# Patient Record
Sex: Female | Born: 1975 | Race: Black or African American | Hispanic: No | Marital: Married | Smoking: Never smoker
Health system: Southern US, Community
[De-identification: ages and names within clinical notes are randomized; demographics above are authoritative.]

## PROBLEM LIST (undated history)

## (undated) ENCOUNTER — Inpatient Hospital Stay (HOSPITAL_COMMUNITY): Payer: Self-pay

## (undated) DIAGNOSIS — B373 Candidiasis of vulva and vagina: Secondary | ICD-10-CM

## (undated) DIAGNOSIS — B999 Unspecified infectious disease: Secondary | ICD-10-CM

## (undated) DIAGNOSIS — N644 Mastodynia: Secondary | ICD-10-CM

## (undated) DIAGNOSIS — Z8742 Personal history of other diseases of the female genital tract: Secondary | ICD-10-CM

## (undated) DIAGNOSIS — R51 Headache: Secondary | ICD-10-CM

## (undated) DIAGNOSIS — H9190 Unspecified hearing loss, unspecified ear: Secondary | ICD-10-CM

## (undated) DIAGNOSIS — Z8669 Personal history of other diseases of the nervous system and sense organs: Secondary | ICD-10-CM

## (undated) DIAGNOSIS — O139 Gestational [pregnancy-induced] hypertension without significant proteinuria, unspecified trimester: Secondary | ICD-10-CM

## (undated) DIAGNOSIS — L739 Follicular disorder, unspecified: Secondary | ICD-10-CM

## (undated) DIAGNOSIS — Z87898 Personal history of other specified conditions: Secondary | ICD-10-CM

## (undated) DIAGNOSIS — Z8619 Personal history of other infectious and parasitic diseases: Secondary | ICD-10-CM

## (undated) DIAGNOSIS — D219 Benign neoplasm of connective and other soft tissue, unspecified: Secondary | ICD-10-CM

## (undated) DIAGNOSIS — IMO0002 Reserved for concepts with insufficient information to code with codable children: Secondary | ICD-10-CM

## (undated) DIAGNOSIS — Z8719 Personal history of other diseases of the digestive system: Secondary | ICD-10-CM

## (undated) DIAGNOSIS — N760 Acute vaginitis: Secondary | ICD-10-CM

## (undated) DIAGNOSIS — F419 Anxiety disorder, unspecified: Secondary | ICD-10-CM

## (undated) DIAGNOSIS — E042 Nontoxic multinodular goiter: Secondary | ICD-10-CM

## (undated) DIAGNOSIS — R102 Pelvic and perineal pain: Secondary | ICD-10-CM

## (undated) DIAGNOSIS — R87619 Unspecified abnormal cytological findings in specimens from cervix uteri: Secondary | ICD-10-CM

## (undated) DIAGNOSIS — N870 Mild cervical dysplasia: Secondary | ICD-10-CM

## (undated) DIAGNOSIS — L732 Hidradenitis suppurativa: Secondary | ICD-10-CM

## (undated) DIAGNOSIS — N764 Abscess of vulva: Secondary | ICD-10-CM

## (undated) DIAGNOSIS — Z87440 Personal history of urinary (tract) infections: Secondary | ICD-10-CM

## (undated) HISTORY — DX: Candidiasis of vulva and vagina: B37.3

## (undated) HISTORY — DX: Mild cervical dysplasia: N87.0

## (undated) HISTORY — DX: Hidradenitis suppurativa: L73.2

## (undated) HISTORY — DX: Unspecified abnormal cytological findings in specimens from cervix uteri: R87.619

## (undated) HISTORY — DX: Personal history of urinary (tract) infections: Z87.440

## (undated) HISTORY — DX: Mastodynia: N64.4

## (undated) HISTORY — DX: Personal history of other diseases of the female genital tract: Z87.42

## (undated) HISTORY — DX: Headache: R51

## (undated) HISTORY — DX: Personal history of other diseases of the nervous system and sense organs: Z86.69

## (undated) HISTORY — DX: Follicular disorder, unspecified: L73.9

## (undated) HISTORY — DX: Personal history of other specified conditions: Z87.898

## (undated) HISTORY — DX: Unspecified infectious disease: B99.9

## (undated) HISTORY — DX: Abscess of vulva: N76.4

## (undated) HISTORY — DX: Reserved for concepts with insufficient information to code with codable children: IMO0002

## (undated) HISTORY — DX: Benign neoplasm of connective and other soft tissue, unspecified: D21.9

## (undated) HISTORY — PX: DILATION AND CURETTAGE OF UTERUS: SHX78

## (undated) HISTORY — DX: Personal history of other infectious and parasitic diseases: Z86.19

## (undated) HISTORY — DX: Personal history of other diseases of the digestive system: Z87.19

## (undated) HISTORY — DX: Acute vaginitis: N76.0

## (undated) HISTORY — DX: Pelvic and perineal pain: R10.2

## (undated) HISTORY — DX: Unspecified hearing loss, unspecified ear: H91.90

---

## 1995-01-23 DIAGNOSIS — B999 Unspecified infectious disease: Secondary | ICD-10-CM

## 1995-01-23 HISTORY — DX: Unspecified infectious disease: B99.9

## 1997-07-11 ENCOUNTER — Emergency Department (HOSPITAL_COMMUNITY): Admission: EM | Admit: 1997-07-11 | Discharge: 1997-07-11 | Payer: Self-pay | Admitting: *Deleted

## 1997-10-15 ENCOUNTER — Encounter: Admission: RE | Admit: 1997-10-15 | Discharge: 1997-10-15 | Payer: Self-pay | Admitting: Internal Medicine

## 1997-12-25 ENCOUNTER — Emergency Department (HOSPITAL_COMMUNITY): Admission: EM | Admit: 1997-12-25 | Discharge: 1997-12-25 | Payer: Self-pay | Admitting: Emergency Medicine

## 1998-01-22 DIAGNOSIS — N870 Mild cervical dysplasia: Secondary | ICD-10-CM

## 1998-01-22 HISTORY — DX: Mild cervical dysplasia: N87.0

## 1998-03-28 ENCOUNTER — Other Ambulatory Visit: Admission: RE | Admit: 1998-03-28 | Discharge: 1998-03-28 | Payer: Self-pay | Admitting: Obstetrics and Gynecology

## 1998-05-18 ENCOUNTER — Other Ambulatory Visit: Admission: RE | Admit: 1998-05-18 | Discharge: 1998-05-18 | Payer: Self-pay | Admitting: Obstetrics and Gynecology

## 1998-05-23 HISTORY — PX: GYNECOLOGIC CRYOSURGERY: SHX857

## 1998-10-10 ENCOUNTER — Other Ambulatory Visit: Admission: RE | Admit: 1998-10-10 | Discharge: 1998-10-10 | Payer: Self-pay | Admitting: Obstetrics & Gynecology

## 1999-02-10 ENCOUNTER — Other Ambulatory Visit: Admission: RE | Admit: 1999-02-10 | Discharge: 1999-02-10 | Payer: Self-pay | Admitting: Obstetrics and Gynecology

## 1999-12-26 ENCOUNTER — Other Ambulatory Visit: Admission: RE | Admit: 1999-12-26 | Discharge: 1999-12-26 | Payer: Self-pay | Admitting: Obstetrics and Gynecology

## 2000-01-23 DIAGNOSIS — B373 Candidiasis of vulva and vagina: Secondary | ICD-10-CM

## 2000-01-23 DIAGNOSIS — B3731 Acute candidiasis of vulva and vagina: Secondary | ICD-10-CM

## 2000-01-23 HISTORY — DX: Candidiasis of vulva and vagina: B37.3

## 2000-01-23 HISTORY — DX: Acute candidiasis of vulva and vagina: B37.31

## 2001-10-22 DIAGNOSIS — L739 Follicular disorder, unspecified: Secondary | ICD-10-CM

## 2001-10-22 HISTORY — DX: Follicular disorder, unspecified: L73.9

## 2001-11-21 ENCOUNTER — Other Ambulatory Visit: Admission: RE | Admit: 2001-11-21 | Discharge: 2001-11-21 | Payer: Self-pay | Admitting: Obstetrics and Gynecology

## 2001-12-04 ENCOUNTER — Encounter: Payer: Self-pay | Admitting: Otolaryngology

## 2001-12-04 ENCOUNTER — Encounter: Admission: RE | Admit: 2001-12-04 | Discharge: 2001-12-04 | Payer: Self-pay | Admitting: Otolaryngology

## 2003-04-27 ENCOUNTER — Other Ambulatory Visit: Admission: RE | Admit: 2003-04-27 | Discharge: 2003-04-27 | Payer: Self-pay | Admitting: Obstetrics and Gynecology

## 2004-04-06 ENCOUNTER — Emergency Department (HOSPITAL_COMMUNITY): Admission: EM | Admit: 2004-04-06 | Discharge: 2004-04-06 | Payer: Self-pay | Admitting: Emergency Medicine

## 2004-07-22 DIAGNOSIS — B373 Candidiasis of vulva and vagina: Secondary | ICD-10-CM

## 2004-07-22 DIAGNOSIS — B3731 Acute candidiasis of vulva and vagina: Secondary | ICD-10-CM

## 2004-07-22 DIAGNOSIS — Z8742 Personal history of other diseases of the female genital tract: Secondary | ICD-10-CM

## 2004-07-22 HISTORY — DX: Personal history of other diseases of the female genital tract: Z87.42

## 2004-07-22 HISTORY — DX: Candidiasis of vulva and vagina: B37.3

## 2004-07-22 HISTORY — DX: Acute candidiasis of vulva and vagina: B37.31

## 2004-08-07 ENCOUNTER — Other Ambulatory Visit: Admission: RE | Admit: 2004-08-07 | Discharge: 2004-08-07 | Payer: Self-pay | Admitting: Obstetrics and Gynecology

## 2004-12-22 DIAGNOSIS — N760 Acute vaginitis: Secondary | ICD-10-CM

## 2004-12-22 HISTORY — DX: Acute vaginitis: N76.0

## 2005-10-08 ENCOUNTER — Other Ambulatory Visit: Admission: RE | Admit: 2005-10-08 | Discharge: 2005-10-08 | Payer: Self-pay | Admitting: Obstetrics and Gynecology

## 2006-01-22 DIAGNOSIS — N644 Mastodynia: Secondary | ICD-10-CM

## 2006-01-22 HISTORY — DX: Mastodynia: N64.4

## 2006-11-04 ENCOUNTER — Encounter: Admission: RE | Admit: 2006-11-04 | Discharge: 2006-11-04 | Payer: Self-pay | Admitting: Obstetrics and Gynecology

## 2009-02-19 ENCOUNTER — Ambulatory Visit (HOSPITAL_COMMUNITY): Admission: AD | Admit: 2009-02-19 | Discharge: 2009-02-19 | Payer: Self-pay | Admitting: Obstetrics and Gynecology

## 2009-05-08 ENCOUNTER — Inpatient Hospital Stay (HOSPITAL_COMMUNITY): Admission: AD | Admit: 2009-05-08 | Discharge: 2009-05-10 | Payer: Self-pay | Admitting: Obstetrics and Gynecology

## 2010-01-22 DIAGNOSIS — R102 Pelvic and perineal pain: Secondary | ICD-10-CM

## 2010-01-22 DIAGNOSIS — N764 Abscess of vulva: Secondary | ICD-10-CM

## 2010-01-22 DIAGNOSIS — L732 Hidradenitis suppurativa: Secondary | ICD-10-CM

## 2010-01-22 HISTORY — DX: Hidradenitis suppurativa: L73.2

## 2010-01-22 HISTORY — DX: Abscess of vulva: N76.4

## 2010-01-22 HISTORY — DX: Pelvic and perineal pain: R10.2

## 2010-04-10 LAB — RH IMMUNE GLOBULIN WORKUP (NOT WOMEN'S HOSP)
ABO/RH(D): O NEG
Antibody Screen: NEGATIVE

## 2010-04-11 LAB — RH IMMUNE GLOB WKUP(>/=20WKS)(NOT WOMEN'S HOSP): Fetal Screen: NEGATIVE

## 2010-04-11 LAB — CBC
HCT: 32.1 % — ABNORMAL LOW (ref 36.0–46.0)
HCT: 39.8 % (ref 36.0–46.0)
Hemoglobin: 10.9 g/dL — ABNORMAL LOW (ref 12.0–15.0)
Hemoglobin: 13.4 g/dL (ref 12.0–15.0)
MCHC: 33.7 g/dL (ref 30.0–36.0)
MCHC: 33.9 g/dL (ref 30.0–36.0)
MCV: 95.8 fL (ref 78.0–100.0)
MCV: 96.5 fL (ref 78.0–100.0)
Platelets: 202 10*3/uL (ref 150–400)
Platelets: 228 10*3/uL (ref 150–400)
RBC: 3.33 MIL/uL — ABNORMAL LOW (ref 3.87–5.11)
RBC: 4.16 MIL/uL (ref 3.87–5.11)
RDW: 13.3 % (ref 11.5–15.5)
RDW: 13.4 % (ref 11.5–15.5)
WBC: 13.4 10*3/uL — ABNORMAL HIGH (ref 4.0–10.5)
WBC: 9.9 10*3/uL (ref 4.0–10.5)

## 2010-08-23 DIAGNOSIS — D219 Benign neoplasm of connective and other soft tissue, unspecified: Secondary | ICD-10-CM

## 2010-08-23 HISTORY — DX: Benign neoplasm of connective and other soft tissue, unspecified: D21.9

## 2010-09-07 ENCOUNTER — Other Ambulatory Visit: Payer: Self-pay | Admitting: Gastroenterology

## 2010-09-11 ENCOUNTER — Ambulatory Visit
Admission: RE | Admit: 2010-09-11 | Discharge: 2010-09-11 | Disposition: A | Discharge status: Home / Self Care | Payer: Federal, State, Local not specified - PPO | Source: Ambulatory Visit | Attending: Gastroenterology | Admitting: Gastroenterology

## 2010-09-11 MED ORDER — IOHEXOL 300 MG/ML  SOLN
100.0000 mL | Freq: Once | INTRAMUSCULAR | Status: AC | PRN
  Administered 2010-09-11: 100 mL via INTRAVENOUS

## 2010-09-13 ENCOUNTER — Other Ambulatory Visit: Payer: Self-pay | Admitting: Obstetrics and Gynecology

## 2010-09-13 DIAGNOSIS — N644 Mastodynia: Secondary | ICD-10-CM

## 2010-09-13 DIAGNOSIS — N6009 Solitary cyst of unspecified breast: Secondary | ICD-10-CM

## 2010-09-19 ENCOUNTER — Ambulatory Visit
Admission: RE | Admit: 2010-09-19 | Discharge: 2010-09-19 | Disposition: A | Discharge status: Home / Self Care | Payer: Federal, State, Local not specified - PPO | Source: Ambulatory Visit | Attending: Obstetrics and Gynecology | Admitting: Obstetrics and Gynecology

## 2010-09-19 DIAGNOSIS — N6009 Solitary cyst of unspecified breast: Secondary | ICD-10-CM

## 2010-09-19 DIAGNOSIS — N644 Mastodynia: Secondary | ICD-10-CM

## 2011-04-23 HISTORY — PX: EYE SURGERY: SHX253

## 2011-06-04 ENCOUNTER — Other Ambulatory Visit: Payer: Self-pay

## 2011-06-05 ENCOUNTER — Other Ambulatory Visit (INDEPENDENT_AMBULATORY_CARE_PROVIDER_SITE_OTHER): Payer: Federal, State, Local not specified - PPO

## 2011-06-05 DIAGNOSIS — N912 Amenorrhea, unspecified: Secondary | ICD-10-CM

## 2011-06-05 NOTE — Progress Notes (Unsigned)
Patient given PNV samples. 

## 2011-06-26 ENCOUNTER — Telehealth: Payer: Self-pay | Admitting: Obstetrics and Gynecology

## 2011-06-26 ENCOUNTER — Inpatient Hospital Stay (HOSPITAL_COMMUNITY)
Admission: AD | Admit: 2011-06-26 | Discharge: 2011-06-26 | Disposition: A | Discharge status: Home / Self Care | Payer: Federal, State, Local not specified - PPO | Source: Ambulatory Visit | Attending: Obstetrics and Gynecology | Admitting: Obstetrics and Gynecology

## 2011-06-26 ENCOUNTER — Other Ambulatory Visit: Payer: Self-pay | Admitting: Obstetrics and Gynecology

## 2011-06-26 DIAGNOSIS — Z3480 Encounter for supervision of other normal pregnancy, unspecified trimester: Secondary | ICD-10-CM | POA: Insufficient documentation

## 2011-06-26 DIAGNOSIS — O26849 Uterine size-date discrepancy, unspecified trimester: Secondary | ICD-10-CM

## 2011-06-26 DIAGNOSIS — Z298 Encounter for other specified prophylactic measures: Secondary | ICD-10-CM | POA: Insufficient documentation

## 2011-06-26 DIAGNOSIS — Z2989 Encounter for other specified prophylactic measures: Secondary | ICD-10-CM | POA: Insufficient documentation

## 2011-06-26 MED ORDER — RHO D IMMUNE GLOBULIN 1500 UNIT/2ML IJ SOLN
300.0000 ug | Freq: Once | INTRAMUSCULAR | Status: AC
  Administered 2011-06-26: 300 ug via INTRAMUSCULAR
  Filled 2011-06-26: qty 2

## 2011-06-26 NOTE — MAU Note (Signed)
Sent over for rhophyllac work up.  No complaints.  Hand out given. Time associated with blood draw discussed.

## 2011-06-26 NOTE — Telephone Encounter (Signed)
Triage/epic 

## 2011-06-26 NOTE — Telephone Encounter (Signed)
PT CALLED, STATES THINKS SHE IS ABOUT 8 WKS BUT NOT REALLY SURE, LMP POSSIBLY 05/01/11?, HAD SOME VAG BLDG ON LAST Thursday SHE THINKS IS R/T A YEAST INFECTION, BLDG LASTED THRU Friday, HAS NOT SEEN ANY SINCE THEN.  USED MONISTAT OTC FOR 2 DAYS B/C SXS STOPPED, HAD TO WEAR A PANTILINER, PT BLOOD TYPE IS 0 NEG, PER VL, PT NEEDS TO GO TO MAU FOR RHOGAM AND SCHED APPT FOR VIABILITY/DATING AND F/U TOMORROW.  PT VOICES UNDERSTANDING, WILL GO TO MAU TODAY FOR RHOGAM, SCHED APPT FOR U/S TMRW 06/27/11 @1000 , F/U W/ VPH AFTER.  PT ADVISED TO CALL OFFICE OR AFTER HOURS IF BLDG RESUMES OR BECOMES HEAVY.

## 2011-06-27 ENCOUNTER — Ambulatory Visit (INDEPENDENT_AMBULATORY_CARE_PROVIDER_SITE_OTHER): Payer: Federal, State, Local not specified - PPO | Admitting: Obstetrics and Gynecology

## 2011-06-27 ENCOUNTER — Other Ambulatory Visit: Payer: Self-pay | Admitting: Obstetrics and Gynecology

## 2011-06-27 ENCOUNTER — Encounter: Payer: Self-pay | Admitting: Obstetrics and Gynecology

## 2011-06-27 ENCOUNTER — Ambulatory Visit (INDEPENDENT_AMBULATORY_CARE_PROVIDER_SITE_OTHER): Payer: Federal, State, Local not specified - PPO

## 2011-06-27 VITALS — BP 100/68 | Ht 64.0 in | Wt 168.0 lb

## 2011-06-27 DIAGNOSIS — L732 Hidradenitis suppurativa: Secondary | ICD-10-CM | POA: Insufficient documentation

## 2011-06-27 DIAGNOSIS — D219 Benign neoplasm of connective and other soft tissue, unspecified: Secondary | ICD-10-CM | POA: Insufficient documentation

## 2011-06-27 DIAGNOSIS — O26899 Other specified pregnancy related conditions, unspecified trimester: Secondary | ICD-10-CM | POA: Insufficient documentation

## 2011-06-27 DIAGNOSIS — O360990 Maternal care for other rhesus isoimmunization, unspecified trimester, not applicable or unspecified: Secondary | ICD-10-CM

## 2011-06-27 DIAGNOSIS — R102 Pelvic and perineal pain: Secondary | ICD-10-CM | POA: Insufficient documentation

## 2011-06-27 DIAGNOSIS — O26849 Uterine size-date discrepancy, unspecified trimester: Secondary | ICD-10-CM

## 2011-06-27 DIAGNOSIS — O209 Hemorrhage in early pregnancy, unspecified: Secondary | ICD-10-CM

## 2011-06-27 DIAGNOSIS — Z8742 Personal history of other diseases of the female genital tract: Secondary | ICD-10-CM | POA: Insufficient documentation

## 2011-06-27 DIAGNOSIS — Z6791 Unspecified blood type, Rh negative: Secondary | ICD-10-CM

## 2011-06-27 DIAGNOSIS — Z87440 Personal history of urinary (tract) infections: Secondary | ICD-10-CM | POA: Insufficient documentation

## 2011-06-27 LAB — US OB TRANSVAGINAL

## 2011-06-27 LAB — RH IG WORKUP (INCLUDES ABO/RH)
ABO/RH(D): O NEG
Antibody Screen: NEGATIVE
Gestational Age(Wks): 8
Unit division: 0

## 2011-06-27 NOTE — Progress Notes (Signed)
Pt with spotting and cramping last week. Pt voices no complaints today. [id number] on ultrasound She had RhoGAM at [facility] on June 26, 2011 Exam: Pelvic: EGBUS within normal limits   Uterus is 6-8 weeks size Ultrasound: Ultrasound:  fetal cardiac activity, EGA: 7 weeks +0 days, S=D Results for orders placed during the hospital encounter of 06/26/11  RH IG WORKUP (INCLUDES ABO/RH)      Component Value Range   Gestational Age(Wks) 8     ABO/RH(D) O NEG     Antibody Screen NEG     Unit Number [id number]     Blood Component Type RHIG     Unit division 00     Status of Unit ISSUED,FINAL     Transfusion Status OK TO TRANSFUSE      Impression: First trimester bleeding resolved Rh-, RhoGAM administered  Recommendation: Keep new OB interview and followup

## 2011-07-05 ENCOUNTER — Ambulatory Visit (INDEPENDENT_AMBULATORY_CARE_PROVIDER_SITE_OTHER): Payer: Federal, State, Local not specified - PPO | Admitting: Obstetrics and Gynecology

## 2011-07-05 ENCOUNTER — Encounter: Payer: Self-pay | Admitting: Obstetrics and Gynecology

## 2011-07-05 ENCOUNTER — Encounter: Payer: Federal, State, Local not specified - PPO | Admitting: Obstetrics and Gynecology

## 2011-07-05 VITALS — BP 102/62 | Wt 172.0 lb

## 2011-07-05 DIAGNOSIS — Z331 Pregnant state, incidental: Secondary | ICD-10-CM

## 2011-07-05 DIAGNOSIS — O09529 Supervision of elderly multigravida, unspecified trimester: Secondary | ICD-10-CM

## 2011-07-05 DIAGNOSIS — Z36 Encounter for antenatal screening of mother: Secondary | ICD-10-CM

## 2011-07-05 LAB — POCT URINALYSIS DIPSTICK
Ketones, UA: NEGATIVE
Spec Grav, UA: <=1.005
Urobilinogen, UA: NEGATIVE
pH, UA: >=9

## 2011-07-05 NOTE — Progress Notes (Signed)
PT INTERESTED IN GENETIC COUNSELING FOR BRCA 1 AND BRCA2 DUE TO FAMILY HX.

## 2011-07-05 NOTE — Progress Notes (Signed)
No complaints

## 2011-07-05 NOTE — Progress Notes (Signed)
AVS

## 2011-07-05 NOTE — Progress Notes (Signed)
[facility] NEW OB EXAMINATION   Paula Perkins is a 36 y.o. female, G2P1001, who presents for a new obstetrical examination at 8 weeks and 1 day gestation by an early ultrasound. The patient is age 36.  Her blood type is O-.  She had first trimester spotting.  She received RhoGAM. Her grandmother had ovarian cancer.  Her grandfather had stomach cancer.  She has a history of rapid labors.  She says this is her last pregnancy.  The following portions of the patient's history were reviewed and updated as appropriate: allergies, current medications, past family history, past medical history, past social history, past surgical history and problem list.    Objective:    BP 102/62  Wt 172 lb (78.019 kg)  LMP 05/01/2010  Breastfeeding? Unknown    Weight:  Wt Readings from Last 1 Encounters:  07/05/11 172 lb (78.019 kg)          BMI: There is no height on file to calculate BMI.  General Appearance: Alert, appropriate appearance for age. No acute distress HEENT: Grossly normal Neck / Thyroid: Supple, no masses, nodes or enlargement Lungs: clear to auscultation bilaterally Back: No CVA tenderness Breast Exam: No masses or nodes.No dimpling, nipple retraction or discharge. Cardiovascular: Regular rate and rhythm. S1, S2, no murmur Gastrointestinal: Soft, non-tender, no masses or organomegaly.                               Fundal height: 8 weeks                               Fetal heart tones audible: no  ++++++++++++++++++++++++++++++++++++++++++++++++++++++++  Pelvic Exam: External genitalia: normal general appearance Vaginal: normal without tenderness, induration or masses and relaxation: Yes Cervix: normal appearance Adnexa: normal bimanual exam Uterus: gravid, nontender, 8 weeks size  ++++++++++++++++++++++++++++++++++++++++++++++++++++++++  Lymphatic Exam: Non-palpable nodes in neck, clavicular, axillary, or inguinal regions Neurologic: Normal speech, no tremor  Psychiatric:  Alert and oriented, appropriate affect.  Wet Prep:   Previously done:            no                     If no: Whiff:                     Negative                              Clue cells:             no                              PH:                        4.5                              Yeast:                    no                              Trichomoniasis:  no  Urinalysis: Negative    Assessment:   36 y.o. female G2P1001 at [redacted]w[redacted]d weeks pregnant  Age 36 years  Blood type O-negative  History of rapid labor  First trimester bleeding now resolved  Patient states this is her last pregnancy   Plan:    GC and Chlamydia sent.  Urine culture sent.  We discussed routine pregnancy issues:  Toxoplasmosis was reviewed.  The patient was told to avoid cat liter boxes and feces.  The patient was told to avoid predator fish including tuna because of our concerns for mercury consumption.  The patient was told to avoid soft cheeses.  The patient was told to be sure that all lunch meats are well cooked.  Genetic screening was discussed. The patient declined amniocentesis.  She will consider harmony testing and let us know at her next visit.  Our model for pregnancy management was reviewed.  Proper diet and exercise reviewed.  Return to office in 4 weeks.  Medications include:  Prenatal vitamins  Genetic testing for cancers was discussed.  I do not believe that the patient is considered an appropriate candidate.  She was told that she can let us know if she wishes to be tested anyway.  Vasectomy discussed with the patient and her husband.  Mylinda Latina.D.

## 2011-07-07 LAB — CULTURE, OB URINE: Colony Count: NO GROWTH

## 2011-07-09 ENCOUNTER — Ambulatory Visit (INDEPENDENT_AMBULATORY_CARE_PROVIDER_SITE_OTHER): Payer: Federal, State, Local not specified - PPO | Admitting: Obstetrics and Gynecology

## 2011-07-09 ENCOUNTER — Other Ambulatory Visit (INDEPENDENT_AMBULATORY_CARE_PROVIDER_SITE_OTHER): Payer: Federal, State, Local not specified - PPO

## 2011-07-09 ENCOUNTER — Telehealth: Payer: Self-pay | Admitting: Obstetrics and Gynecology

## 2011-07-09 ENCOUNTER — Other Ambulatory Visit: Payer: Self-pay | Admitting: Obstetrics and Gynecology

## 2011-07-09 VITALS — BP 110/60

## 2011-07-09 DIAGNOSIS — O209 Hemorrhage in early pregnancy, unspecified: Secondary | ICD-10-CM

## 2011-07-09 DIAGNOSIS — O039 Complete or unspecified spontaneous abortion without complication: Secondary | ICD-10-CM

## 2011-07-09 LAB — PRENATAL PANEL VII
Basophils Relative: 0 % (ref 0–1)
Eosinophils Absolute: 0.1 10*3/uL (ref 0.0–0.7)
HIV: NONREACTIVE
Hemoglobin: 12 g/dL (ref 12.0–15.0)
Hepatitis B Surface Ag: NEGATIVE
Lymphs Abs: 2.5 10*3/uL (ref 0.7–4.0)
MCH: 29.7 pg (ref 26.0–34.0)
Monocytes Relative: 9 % (ref 3–12)
Neutro Abs: 3.8 10*3/uL (ref 1.7–7.7)
Neutrophils Relative %: 54 % (ref 43–77)
Platelets: 362 10*3/uL (ref 150–400)
RBC: 4.04 MIL/uL (ref 3.87–5.11)
Rubella: 15.8 IU/mL — ABNORMAL HIGH

## 2011-07-09 LAB — PRENATAL ANTIBODY IDENTIFICATION

## 2011-07-09 NOTE — Telephone Encounter (Signed)
Spoke with pt. Pt noticed spotting with wiping today. Mild cramping last pm. No fever. Appt sched today with ultrasound for viability and fu with vl. Pt voices understanding.

## 2011-07-09 NOTE — Telephone Encounter (Signed)
IN from pt's husband, stating pt has requested to have a D&C, I could hear pt talking in the background. D/W V.Emilee Hero, CNM and Dr Normand Sloop and surgery scheduler will call pt tomorrow with surgery date and time. Called pt back at ph# [phone] and LM that office will call tmrw with surgery info.

## 2011-07-10 ENCOUNTER — Other Ambulatory Visit: Payer: Self-pay | Admitting: Obstetrics and Gynecology

## 2011-07-10 ENCOUNTER — Encounter (HOSPITAL_COMMUNITY): Payer: Self-pay | Admitting: *Deleted

## 2011-07-10 DIAGNOSIS — O039 Complete or unspecified spontaneous abortion without complication: Secondary | ICD-10-CM | POA: Insufficient documentation

## 2011-07-10 NOTE — H&P (Signed)
Paula Perkins is a 36 y.o. female presenting for D&E due to IUFD at [redacted]w[redacted]d noted 07/09/11 on office [place].  Patient had  Been seen in the office on 6/13 for NOB work-up, with [place] on 6/4 showing a 7 with living IUP.  She had bleeding noted around that time, with Rhophylac given that day. She had experience resolution of that bleeding, but began to bleed again on the evening of 6/16, and was seen in the office on 07/09/11.  She reported the bleeding was then a small amount, and she was having only minimal cramping.  [place] was done, and the SAB was diagnosed.  Options for observation, medical management of SAB with [employer] , and D&E were reviewed with the patient.   She wishes to proceed with D&E.  Obstetrical History: Previous SVD, and received Rhophylac during that pregnancy.  Patient Active Problem List  Diagnosis  . Fibroids  . Pelvic pain  . Hx: UTI (urinary tract infection)  . Hydradenitis  . Hx of menorrhagia  . First trimester bleeding  . Rh negative state in antepartum period  . SAB (spontaneous abortion)      Past Medical History  Diagnosis Date  . Pelvic pain 2012  . Hx: UTI (urinary tract infection)   . Hydradenitis   . Hx of menorrhagia   . Labial abscess 01/2010  . Mastodynia 2008  . H/O varicella   . H/O measles   . CIN I (cervical intraepithelial neoplasia I) 2000  . Folliculitis 10/2001    Left  buttock  . Candida vaginitis 07/2004  . H/O amenorrhea 07/2004  . Purulent vaginitis 12/2004  . Monilial vaginitis 2002  . Hydradenitis 2012  . H/O oral aphthous ulcers   . H/O chest pain   . Hx of migraines   . Abnormal Pap smear 2004 & 2005    CRYO  LAST PAP 02/2011  . Infection 1997    CHLAMYDIA; TRICH  . Infection     OCC YEAST  . Headache     MIGRAINES WITH PREV PREG  . Fibroids 08/2010  . GERD (gastroesophageal reflux disease)     STOPMACH SPASMS; HAD EVAL BY GI  . Hearing loss    Past Surgical History  Procedure Date  . Gynecologic cryosurgery 05/1998    . Eye surgery 04/2011    STYE ON R EYE   Family History: family history includes Alcohol abuse in her maternal uncle; Aneurysm in her maternal grandfather; Arthritis in her maternal aunt; Asthma in her other; Cancer in her maternal grandmother, paternal grandfather, and paternal uncle; Depression in her maternal uncle; Diabetes in her maternal aunt; Hearing loss in her cousin, father, paternal grandfather, paternal uncle, and sister; Heart disease in her maternal aunt and maternal uncle; Hyperlipidemia in her mother; Hypertension in her mother; and Kidney disease in her maternal grandfather.  Social History:  reports that she has never smoked. She has never used smokeless tobacco. She reports that she does not drink alcohol or use illicit drugs.  Her husband is involved and supportive.  ROS;  Small amount vaginal bleeding, minimal cramping.  No other significant symptoms.    Physical exam: Chest clear Heart RRR without murmur Abd soft, NT Pelvic--small amount dark vaginal bleeding, cervix closed, NT.  Uterus small, NT Ext WNL    Prenatal labs: ABO, Rh: O/NEG/-- (06/13 1436) Antibody: POS (06/13 1436) (anti-D, s/p Rhophylac on 06/26/11) Rubella:  Immune RPR: NON REAC (06/13 1436)  HBsAg: NEGATIVE (06/13 1436)  HIV:  NON REACTIVE (06/13 1436)  GBS:   NA  GC, chlamydia negative on 07/05/11. Hgb 12 at NOB on 6/13  Assessment/Plan: SAB/IUFD at [redacted]w[redacted]d Rh negative, with recent Rhophylac Desires D&E.  Plan: Patient will be scheduled for D&E this week. R&B of D&E reviewed with the patient on 07/09/11, and will be reviewed again by MD prior to the surgery. Support to the patient for her loss. Information given to the patient on Heartstrings from the office.   Nigel Bridgeman 07/10/2011, 8:32 AM

## 2011-07-10 NOTE — Progress Notes (Signed)
Here at 8 w 5/7 days with onset of vaginal bleeding last night--still has small amount, but less than yesterday.  Minimal cramping. US showed 8 week IUFD. Options reviewed and support offered. Patient will go home and discuss options with family.  She will call back to inform of decision (observation, cytotech, D&E). Just received Rhophylac on 06/26/11.  Addendum: Patient called CNM on call at 5pm--desires D/C. Dr. Normand Sloop notified by Sanda Klein, CNM. Chart to [facility] for scheduling of D&E this week. Vance Gather communicated this plan to the patient. I will complete pre-op H&P.

## 2011-07-11 ENCOUNTER — Ambulatory Visit (HOSPITAL_COMMUNITY)
Admission: RE | Admit: 2011-07-11 | Discharge: 2011-07-11 | Disposition: A | Discharge status: Home / Self Care | Payer: Federal, State, Local not specified - PPO | Source: Ambulatory Visit | Attending: Obstetrics and Gynecology | Admitting: Obstetrics and Gynecology

## 2011-07-11 ENCOUNTER — Encounter (HOSPITAL_COMMUNITY): Payer: Self-pay | Admitting: *Deleted

## 2011-07-11 ENCOUNTER — Encounter (HOSPITAL_COMMUNITY)
Admission: RE | Disposition: A | Discharge status: Home / Self Care | Payer: Self-pay | Source: Ambulatory Visit | Attending: Obstetrics and Gynecology

## 2011-07-11 ENCOUNTER — Encounter (HOSPITAL_COMMUNITY): Payer: Self-pay | Admitting: Anesthesiology

## 2011-07-11 ENCOUNTER — Encounter: Payer: Self-pay | Admitting: Obstetrics and Gynecology

## 2011-07-11 ENCOUNTER — Encounter (HOSPITAL_COMMUNITY): Payer: Self-pay | Admitting: Pharmacist

## 2011-07-11 ENCOUNTER — Ambulatory Visit (HOSPITAL_COMMUNITY): Payer: Federal, State, Local not specified - PPO | Admitting: Anesthesiology

## 2011-07-11 DIAGNOSIS — O021 Missed abortion: Secondary | ICD-10-CM | POA: Insufficient documentation

## 2011-07-11 HISTORY — DX: Anxiety disorder, unspecified: F41.9

## 2011-07-11 HISTORY — PX: DILATION AND EVACUATION: SHX1459

## 2011-07-11 LAB — US OB TRANSVAGINAL

## 2011-07-11 LAB — CBC
HCT: 37.3 % (ref 36.0–46.0)
Hemoglobin: 12.3 g/dL (ref 12.0–15.0)
MCH: 29.4 pg (ref 26.0–34.0)
MCHC: 33 g/dL (ref 30.0–36.0)
MCV: 89.2 fL (ref 78.0–100.0)

## 2011-07-11 LAB — HEMOGLOBINOPATHY EVALUATION
Hemoglobin Other: 0 %
Hgb A2 Quant: 2.9 % (ref 2.2–3.2)
Hgb A: 97.1 % (ref 96.8–97.8)
Hgb S Quant: 0 %

## 2011-07-11 SURGERY — DILATION AND EVACUATION, UTERUS
Anesthesia: Monitor Anesthesia Care | Site: Vagina | Wound class: Clean Contaminated

## 2011-07-11 MED ORDER — PROMETHAZINE HCL 25 MG/ML IJ SOLN: 6.2500 mg | INTRAMUSCULAR | Status: DC | PRN

## 2011-07-11 MED ORDER — LACTATED RINGERS IV SOLN
INTRAVENOUS | Status: DC
  Administered 2011-07-11: 11:00:00 via INTRAVENOUS

## 2011-07-11 MED ORDER — PROPOFOL 10 MG/ML IV EMUL
INTRAVENOUS | Status: AC
  Filled 2011-07-11: qty 20

## 2011-07-11 MED ORDER — FENTANYL CITRATE 0.05 MG/ML IJ SOLN: 25.0000 ug | INTRAMUSCULAR | Status: DC | PRN

## 2011-07-11 MED ORDER — ACETAMINOPHEN 325 MG PO TABS: 325.0000 mg | ORAL_TABLET | ORAL | Status: DC | PRN

## 2011-07-11 MED ORDER — BUPIVACAINE-EPINEPHRINE 0.5% -1:200000 IJ SOLN
INTRAMUSCULAR | Status: DC | PRN
  Administered 2011-07-11: 10 mL

## 2011-07-11 MED ORDER — KETOROLAC TROMETHAMINE 60 MG/2ML IM SOLN
INTRAMUSCULAR | Status: AC
  Filled 2011-07-11: qty 2

## 2011-07-11 MED ORDER — OXYCODONE-ACETAMINOPHEN 5-325 MG PO TABS
1.0000 | ORAL_TABLET | ORAL | Status: AC | PRN
Start: 2011-07-11 — End: 2011-07-21

## 2011-07-11 MED ORDER — PROPOFOL 10 MG/ML IV EMUL
INTRAVENOUS | Status: DC | PRN
  Administered 2011-07-11: 30 mg via INTRAVENOUS
  Administered 2011-07-11: 20 mg via INTRAVENOUS
  Administered 2011-07-11 (×2): 30 mg via INTRAVENOUS
  Administered 2011-07-11: 20 mg via INTRAVENOUS
  Administered 2011-07-11: 30 mg via INTRAVENOUS
  Administered 2011-07-11: 20 mg via INTRAVENOUS
  Administered 2011-07-11: 10 mg via INTRAVENOUS

## 2011-07-11 MED ORDER — ONDANSETRON HCL 4 MG/2ML IJ SOLN
INTRAMUSCULAR | Status: AC
  Filled 2011-07-11: qty 2

## 2011-07-11 MED ORDER — MIDAZOLAM HCL 2 MG/2ML IJ SOLN
INTRAMUSCULAR | Status: AC
  Filled 2011-07-11: qty 2

## 2011-07-11 MED ORDER — MIDAZOLAM HCL 2 MG/2ML IJ SOLN: 0.5000 mg | Freq: Once | INTRAMUSCULAR | Status: DC | PRN

## 2011-07-11 MED ORDER — FENTANYL CITRATE 0.05 MG/ML IJ SOLN
INTRAMUSCULAR | Status: AC
  Filled 2011-07-11: qty 2

## 2011-07-11 MED ORDER — BUPIVACAINE-EPINEPHRINE (PF) 0.5% -1:200000 IJ SOLN
INTRAMUSCULAR | Status: AC
  Filled 2011-07-11: qty 10

## 2011-07-11 MED ORDER — LIDOCAINE HCL (CARDIAC) 20 MG/ML IV SOLN
INTRAVENOUS | Status: DC | PRN
  Administered 2011-07-11: 30 mg via INTRAVENOUS

## 2011-07-11 MED ORDER — LIDOCAINE HCL (CARDIAC) 20 MG/ML IV SOLN
INTRAVENOUS | Status: AC
  Filled 2011-07-11: qty 5

## 2011-07-11 MED ORDER — ONDANSETRON HCL 4 MG/2ML IJ SOLN
INTRAMUSCULAR | Status: DC | PRN
  Administered 2011-07-11: 4 mg via INTRAVENOUS

## 2011-07-11 MED ORDER — KETOROLAC TROMETHAMINE 30 MG/ML IJ SOLN: 15.0000 mg | Freq: Once | INTRAMUSCULAR | Status: DC | PRN

## 2011-07-11 MED ORDER — KETOROLAC TROMETHAMINE 30 MG/ML IJ SOLN
INTRAMUSCULAR | Status: DC | PRN
  Administered 2011-07-11: 60 mg via INTRAVENOUS

## 2011-07-11 MED ORDER — FENTANYL CITRATE 0.05 MG/ML IJ SOLN
INTRAMUSCULAR | Status: DC | PRN
  Administered 2011-07-11 (×2): 50 ug via INTRAVENOUS

## 2011-07-11 MED ORDER — MEPERIDINE HCL 25 MG/ML IJ SOLN: 6.2500 mg | INTRAMUSCULAR | Status: DC | PRN

## 2011-07-11 MED ORDER — PROMETHAZINE HCL 12.5 MG PO TABS: 12.5000 mg | ORAL_TABLET | Freq: Four times a day (QID) | ORAL | Status: DC | PRN

## 2011-07-11 MED ORDER — MIDAZOLAM HCL 5 MG/5ML IJ SOLN
INTRAMUSCULAR | Status: DC | PRN
  Administered 2011-07-11: 2 mg via INTRAVENOUS

## 2011-07-11 MED ORDER — IBUPROFEN 800 MG PO TABS: 800.0000 mg | ORAL_TABLET | Freq: Three times a day (TID) | ORAL | Status: AC | PRN

## 2011-07-11 SURGICAL SUPPLY — 20 items
CATH ROBINSON RED A/P 16FR (CATHETERS) ×2 IMPLANT
CLOTH BEACON ORANGE TIMEOUT ST (SAFETY) ×2 IMPLANT
DECANTER SPIKE VIAL GLASS SM (MISCELLANEOUS) ×2 IMPLANT
GLOVE BIOGEL PI IND STRL 8.5 (GLOVE) ×1 IMPLANT
GLOVE BIOGEL PI INDICATOR 8.5 (GLOVE) ×1
GLOVE ECLIPSE 8.0 STRL XLNG CF (GLOVE) ×4 IMPLANT
GOWN PREVENTION PLUS LG XLONG (DISPOSABLE) ×2 IMPLANT
KIT BERKELEY 1ST TRIMESTER 3/8 (MISCELLANEOUS) ×2 IMPLANT
NDL SPNL 22GX3.5 QUINCKE BK (NEEDLE) ×1 IMPLANT
NEEDLE SPNL 22GX3.5 QUINCKE BK (NEEDLE) ×2 IMPLANT
NS IRRIG 1000ML POUR BTL (IV SOLUTION) ×2 IMPLANT
PACK VAGINAL MINOR WOMEN LF (CUSTOM PROCEDURE TRAY) ×2 IMPLANT
PAD PREP 24X48 CUFFED NSTRL (MISCELLANEOUS) ×2 IMPLANT
SET BERKELEY SUCTION TUBING (SUCTIONS) ×2 IMPLANT
SYR CONTROL 10ML LL (SYRINGE) ×2 IMPLANT
TOWEL OR 17X24 6PK STRL BLUE (TOWEL DISPOSABLE) ×4 IMPLANT
VACURETTE 10 RIGID CVD (CANNULA) IMPLANT
VACURETTE 7MM CVD STRL WRAP (CANNULA) IMPLANT
VACURETTE 8 RIGID CVD (CANNULA) ×1 IMPLANT
VACURETTE 9 RIGID CVD (CANNULA) IMPLANT

## 2011-07-11 NOTE — Anesthesia Postprocedure Evaluation (Signed)
Anesthesia Post Note  Patient: Paula Perkins  Procedure(s) Performed: Procedure(s) (LRB): DILATATION AND EVACUATION (N/A)  Anesthesia type: MAC  Patient location: PACU  Post pain: Pain level controlled  Post assessment: Post-op Vital signs reviewed  Last Vitals:  Filed Vitals:   07/11/11 1248  BP:   Pulse: 60  Temp: 36.7 C  Resp: 17    Post vital signs: Reviewed  Level of consciousness: sedated  Complications: No apparent anesthesia complications

## 2011-07-11 NOTE — Anesthesia Preprocedure Evaluation (Signed)
Anesthesia Evaluation  Patient identified by MRN, date of birth, ID band Patient awake    Reviewed: Allergy & Precautions, H&P , Patient's Chart, lab work & pertinent test results, reviewed documented beta blocker date and time   History of Anesthesia Complications Negative for: history of anesthetic complications  Airway Mallampati: II TM Distance: >3 FB Neck ROM: full    Dental No notable dental hx.    Pulmonary neg pulmonary ROS,  breath sounds clear to auscultation  Pulmonary exam normal       Cardiovascular Exercise Tolerance: Good negative cardio ROS  Rhythm:regular Rate:Normal     Neuro/Psych  Headaches, negative neurological ROS  negative psych ROS   GI/Hepatic negative GI ROS, Neg liver ROS,   Endo/Other  negative endocrine ROS  Renal/GU negative Renal ROS     Musculoskeletal   Abdominal   Peds  Hematology negative hematology ROS (+)   Anesthesia Other Findings   Reproductive/Obstetrics negative OB ROS                           Anesthesia Physical Anesthesia Plan  ASA: II  Anesthesia Plan: MAC   Post-op Pain Management:    Induction:   Airway Management Planned:   Additional Equipment:   Intra-op Plan:   Post-operative Plan:   Informed Consent: I have reviewed the patients History and Physical, chart, labs and discussed the procedure including the risks, benefits and alternatives for the proposed anesthesia with the patient or authorized representative who has indicated his/her understanding and acceptance.   Dental Advisory Given  Plan Discussed with: CRNA, Surgeon and Anesthesiologist  Anesthesia Plan Comments:         Anesthesia Quick Evaluation

## 2011-07-11 NOTE — Transfer of Care (Signed)
Immediate Anesthesia Transfer of Care Note  Patient: Paula Perkins  Procedure(s) Performed: Procedure(s) (LRB): DILATATION AND EVACUATION (N/A)  Patient Location: PACU  Anesthesia Type: MAC  Level of Consciousness: awake  Airway & Oxygen Therapy: Patient Spontanous Breathing  Post-op Assessment: Report given to PACU RN  Post vital signs: Reviewed  Complications: No apparent anesthesia complications

## 2011-07-11 NOTE — H&P (Signed)
The patient was interviewed and examined today prior to her surgery.  The previously documented history and physical examination was reviewed. There are no changes. The operative procedure was reviewed. The risks and benefits were outlined again. The specific risks include, but are not limited to, anesthetic complications, bleeding, infections, and possible damage to the surrounding organs. The patient's questions were answered.  We are ready to proceed as outlined. The likelihood of the patient achieving the goals of this procedure is very likely.   Leonard Schwartz, M.D.

## 2011-07-11 NOTE — Discharge Instructions (Signed)
Dilation and Curettage or Vacuum Curettage Dilation and curettage (D&C) and vacuum curettage are minor procedures. A D&C involves stretching (dilation) the cervix and scraping (curettage) the inside lining of the womb (uterus). During a D&C, tissue is gently scraped from the inside lining of the uterus. During a vacuum curettage, the lining and tissue in the uterus are removed with the use of gentle suction. Curettage may be performed for diagnostic or therapeutic purposes. As a diagnostic procedure, curettage is performed for the purpose of examining tissues from the uterus. Tissue examination may help determine causes or treatment options for symptoms. A diagnostic curettage may be performed for the following symptoms:  Irregular bleeding in the uterus.   Bleeding with the development of clots.   Spotting between menstrual periods.   Prolonged menstrual periods.   Bleeding after menopause.   No menstrual period (amenorrhea).   A change in size and shape of the uterus.  A therapeutic curettage is performed to remove tissue, blood, or a contraceptive device. Therapeutic curettage may be performed for the following conditions:   Removal of an IUD (intrauterine device).   Removal of retained placenta after giving birth. Retained placenta can cause bleeding severe enough to require transfusions or an infection.   Abortion.   Miscarriage.   Removal of polyps inside the uterus.   Removal of uncommon types of fibroids (noncancerous lumps).  LET YOUR CAREGIVER KNOW ABOUT:   Allergies to food or medicine.   Medicines taken, including vitamins, herbs, eyedrops, over-the-counter medicines, and creams.   Use of steroids (by mouth or creams).   Previous problems with anesthetics or numbing medicines.   History of bleeding problems or blood clots.   Previous surgery.   Other health problems, including diabetes and kidney problems.   Possibility of pregnancy, if this applies.  RISKS  AND COMPLICATIONS   Excessive bleeding.   Infection of the uterus.   Damage to the cervix.   Development of scar tissue (adhesions) inside the uterus, later causing abnormal amounts of menstrual bleeding.   Complications from the general anesthetic, if a general anesthetic is used.   Putting a hole (perforation) in the uterus. This is rare.  BEFORE THE PROCEDURE   Eat and drink before the procedure only as directed by your caregiver.   Arrange for someone to take you home.  PROCEDURE   This procedure may be done in a hospital, outpatient clinic, or caregiver's office.   You may be given a general anesthetic or a local anesthetic in and around the cervix.   You will lie on your back with your legs in stirrups.   There are two ways in which your cervix can be softened and dilated. These include:   Taking a medicine.   Having thin rods (laminaria) inserted into your cervix.   A curved tool (curette) will scrape cells from the inside lining of the uterus and will then be removed.  This procedure usually takes about 15 to 30 minutes. AFTER THE PROCEDURE   You will rest in the recovery area until you are stable and are ready to go home.   You will need to have someone take you home.   You may feel sick to your stomach (nauseous) or throw up (vomit) if you had general anesthesia.   You may have a sore throat if a tube was placed in your throat during general anesthesia.   You may have light cramping and bleeding for 2 days to 2 weeks after the procedure.     Your uterus needs to make a new lining after the procedure. This may make your next period late.  Document Released: 01/08/2005 Document Revised: 12/28/2010 Document Reviewed: 08/06/2008 [employer] Patient Information 2012 [employer].DISCHARGE INSTRUCTIONS: D&C / D&E The following instructions have been prepared to help you care for yourself upon your return home.   Personal hygiene: . Use sanitary pads for vaginal  drainage, not tampons. . Shower the day after your procedure. . NO tub baths, pools or Jacuzzis for 2-3 weeks. . Wipe front to back after using the bathroom.  Activity and limitations: . Do NOT drive or operate any equipment for 24 hours. The effects of anesthesia are still present and drowsiness may result. . Do NOT rest in bed all day. . Walking is encouraged. . Walk up and down stairs slowly. . You may resume your normal activity in one to two days or as indicated by your physician.  Sexual activity: NO intercourse for at least 2 weeks after the procedure, or as indicated by your physician.  Diet: Eat a light meal as desired this evening. You may resume your usual diet tomorrow.  Return to work: You may resume your work activities in one to two days or as indicated by your doctor.  What to expect after your surgery: Expect to have vaginal bleeding/discharge for 2-3 days and spotting for up to 10 days. It is not unusual to have soreness for up to 1-2 weeks. You may have a slight burning sensation when you urinate for the first day. Mild cramps may continue for a couple of days. You may have a regular period in 2-6 weeks.  Call your doctor for any of the following: . Excessive vaginal bleeding, saturating and changing one pad every hour. . Inability to urinate 6 hours after discharge from hospital. . Pain not relieved by pain medication. . Fever of 100.4 F or greater. . Unusual vaginal discharge or odor.  Return to office ________________ Call for an appointment ___________________  Patient's signature: ______________________  Nurse's signature ________________________  Post Anesthesia Care Unit [phone]   

## 2011-07-11 NOTE — OR Nursing (Signed)
Dr Stefano Gaul states pt received rhogam in office states she doesn't need it again. - Melva Faux rn

## 2011-07-11 NOTE — Op Note (Signed)
OPERATIVE NOTE  Paula Perkins  DOB:    1975/04/18  MRN:    [id number]  CSN:    [id number]  Date of Surgery:  07/11/2011  Preoperative Diagnosis:  Missed Abortion at [redacted]w[redacted]d Gestation  Postoperative Diagnosis:  Missed Abortion at [redacted]w[redacted]d Gestation  Procedure:  FIRST TRIMESTER SUCTION DILATATION AND CURETTAGE  Surgeon:  Leonard Schwartz, M.D.  Assistant:  None  Anesthetic:  Monitored anesthesia care  Disposition:  The patient is a 36 y.o. year old female who presents at 100w0d gestation with a nonviable gestation based on ultrasound and/or quantitative hCG values. She understands the indications for her surgical procedure as well as the alternative treatment options. She accepts the risk of, but not limited to, anesthetic complications, bleeding, infections, and possible damage to the surrounding organs.  Findings:  The patient's blood type is O-. The patient received RhoGAM 2 weeks ago and therefore does not need additional doses today.  A moderate amount of products of conception were removed from within a 10 weeks size uterus. No adnexal masses were appreciated.  Procedure:  The patient was taken to the operating room where a Choice anesthetic was given. The patient's abdomen, perineum, and vagina were prepped with Betadine. The bladder was drained of urine. The patient was sterilely draped. Examination under anesthesia was performed. A paracervical block was placed using 10 cc of half percent Marcaine with epinephrine. The uterus sounded to 9 centimeters. The cervix was gently dilated. The uterine cavity was evacuated using a size 8 suction curet. The cavity was then cleaned using a medium sharp curet. The cavity was felt to be clean at the end of our procedure. The estimated blood loss was 20 cc. The uterus was reexamined and was noted to be firm. Hemostasis was adequate. Sponge and needle counts were correct. The patient tolerated her but her procedure well. She  was awakened from her anesthetic without difficulty. She was transported to the recovery room in stable condition. The products of conception were sent to pathology.  Followup instructions:  The patient return to see Dr. Stefano Gaul in 2 weeks. She was given prescriptions for pain medications. She was given instructions for patients who've undergone the surgical procedure. She will call for questions or concerns.  Leonard Schwartz, M.D.

## 2011-07-12 ENCOUNTER — Encounter (HOSPITAL_COMMUNITY): Payer: Self-pay | Admitting: Obstetrics and Gynecology

## 2011-07-23 ENCOUNTER — Encounter: Payer: Self-pay | Admitting: Obstetrics and Gynecology

## 2011-07-23 ENCOUNTER — Ambulatory Visit (INDEPENDENT_AMBULATORY_CARE_PROVIDER_SITE_OTHER): Payer: Federal, State, Local not specified - PPO | Admitting: Obstetrics and Gynecology

## 2011-07-23 VITALS — BP 130/86 | HR 80 | Temp 99.0°F | Resp 16 | Ht 64.0 in | Wt 175.0 lb

## 2011-07-23 DIAGNOSIS — O021 Missed abortion: Secondary | ICD-10-CM

## 2011-07-23 NOTE — Progress Notes (Signed)
DATE OF SURGERY: 07-11-2011 TYPE OF SURGERY:D&C PAIN:Yes Cramping fist 2 days . VAG BLEEDING: yes VAG DISCHARGE: yes NORMAL GI FUNCTN: yes NORMAL GU FUNCTN: yes  HISTORY OF PRESENT ILLNESS  Ms. Paula Perkins is a 36 y.o. year old female,G2P1001, who presents for a postop visit. She had a dilatation and evacuation on July 11, 2011 for a first trimester miscarriage.  Subjective:  The patient complains of headaches. She has sinus congestion.  She has taken Claritin and aspirin.  She has a history of chronic pelvic pain.  Objective:  BP 130/86  Pulse 80  Temp 99 F (37.2 C) (Oral)  Resp 16  Ht 5\' 4"  (1.626 m)  Wt 175 lb (79.379 kg)  BMI 30.04 kg/m2  LMP 05/01/2010  Breastfeeding? Unknown   General: alert, cooperative and no distress GI: soft, non-tender; bowel sounds normal; no masses,  no organomegaly  External genitalia: normal general appearance Vaginal: normal mucosa without prolapse or lesions Adnexa: normal bimanual exam Uterus: normal size shape and consistency  Path report: Benign products of conception.  Assessment:  Doing well status post dilatation and evacuation for first trimester missed abortion. Probable sinus headache.  Plan:  Patient to see her family physician about her headaches. Return to normal activities. Okay to try to get pregnant.  Return to office in 7 month(s) for annual exam.   Leonard Schwartz M.D.  07/23/2011 2:23 PM

## 2011-07-27 ENCOUNTER — Encounter: Payer: Federal, State, Local not specified - PPO | Admitting: Obstetrics and Gynecology

## 2011-12-07 ENCOUNTER — Inpatient Hospital Stay (HOSPITAL_COMMUNITY)
Admission: AD | Admit: 2011-12-07 | Discharge: 2011-12-07 | Disposition: A | Discharge status: Home / Self Care | Payer: Federal, State, Local not specified - PPO | Source: Ambulatory Visit | Attending: Obstetrics and Gynecology | Admitting: Obstetrics and Gynecology

## 2011-12-07 ENCOUNTER — Encounter (HOSPITAL_COMMUNITY): Payer: Self-pay | Admitting: *Deleted

## 2011-12-07 ENCOUNTER — Telehealth: Payer: Self-pay | Admitting: Obstetrics and Gynecology

## 2011-12-07 ENCOUNTER — Inpatient Hospital Stay (HOSPITAL_COMMUNITY): Payer: Federal, State, Local not specified - PPO

## 2011-12-07 DIAGNOSIS — O200 Threatened abortion: Secondary | ICD-10-CM

## 2011-12-07 DIAGNOSIS — Z298 Encounter for other specified prophylactic measures: Secondary | ICD-10-CM | POA: Insufficient documentation

## 2011-12-07 DIAGNOSIS — O209 Hemorrhage in early pregnancy, unspecified: Secondary | ICD-10-CM | POA: Insufficient documentation

## 2011-12-07 DIAGNOSIS — Z2989 Encounter for other specified prophylactic measures: Secondary | ICD-10-CM | POA: Insufficient documentation

## 2011-12-07 LAB — CBC WITH DIFFERENTIAL/PLATELET
Eosinophils Relative: 1 % (ref 0–5)
HCT: 37.9 % (ref 36.0–46.0)
Hemoglobin: 12.6 g/dL (ref 12.0–15.0)
Lymphocytes Relative: 28 % (ref 12–46)
Lymphs Abs: 2.1 10*3/uL (ref 0.7–4.0)
MCV: 89.8 fL (ref 78.0–100.0)
Monocytes Absolute: 0.8 10*3/uL (ref 0.1–1.0)
Monocytes Relative: 11 % (ref 3–12)
Neutro Abs: 4.5 10*3/uL (ref 1.7–7.7)
RBC: 4.22 MIL/uL (ref 3.87–5.11)
RDW: 13.6 % (ref 11.5–15.5)
WBC: 7.6 10*3/uL (ref 4.0–10.5)

## 2011-12-07 LAB — WET PREP, GENITAL
Clue Cells Wet Prep HPF POC: NONE SEEN
Trich, Wet Prep: NONE SEEN

## 2011-12-07 MED ORDER — RHO D IMMUNE GLOBULIN 1500 UNIT/2ML IJ SOLN
300.0000 ug | Freq: Once | INTRAMUSCULAR | Status: AC
  Administered 2011-12-07: 300 ug via INTRAMUSCULAR

## 2011-12-07 NOTE — Telephone Encounter (Signed)
TC from pt. States had +UPT 11/02/11. Has hx SAB.  LMP 10/2011?  Is having pink spotting.  No pain or cramping.  No recent IC.  Per MK, pt to MAU since is Rh neg. Pt verbalizes comprehension.

## 2011-12-07 NOTE — MAU Note (Signed)
Pt reports she started having some light pink/red spotting. Denies pain or cramping.

## 2011-12-07 NOTE — Progress Notes (Signed)
History  Paula Perkins is a 36 y.o. G3P1011 at [redacted]w[redacted]d   Chief Complaint  Patient presents with  . Vaginal Bleeding  certain of LMP 1//10/2011 not on birth control, no cramping or pain, just pink/red spotting    [redacted]w[redacted]d   Chief Complaint  Patient presents with  . Vaginal Bleeding   @SFHPI @  Prior to Admission medications   Medication Sig Start Date End Date Taking? Authorizing Provider  loratadine (CLARITIN) 10 MG tablet Take 10 mg by mouth daily as needed. For allergy symptoms    Historical Provider, MD  Prenatal Vit-Fe Fumarate-FA (MULTIVITAMIN-PRENATAL) 27-0.8 MG TABS Take 1 tablet by mouth daily.    Historical Provider, MD  promethazine (PHENERGAN) 12.5 MG tablet Take 1 tablet (12.5 mg total) by mouth every 6 (six) hours as needed for nausea. 07/11/11 07/18/11  Kirkland Hun, MD    Patient Active Problem List  Diagnosis  . Fibroids  . Pelvic pain  . Hx: UTI (urinary tract infection)  . Hydradenitis  . Hx of menorrhagia  . First trimester bleeding  . Rh negative state in antepartum period   Vitals:  Blood pressure 133/91, pulse 98, temperature 98.5 F (36.9 C), temperature source Oral, resp. rate 18, height 5\' 4"  (1.626 m), weight 78.835 kg (173 lb 12.8 oz), last menstrual period 11/01/2010. OB History    Grav Para Term Preterm Abortions TAB SAB Ect Mult Living   3 1 1  1  1   1      Obstetric Comments   RECEIVED RHOGAM 2011      Past Medical History  Diagnosis Date  . Pelvic pain 2012  . Hx: UTI (urinary tract infection)   . Hydradenitis   . Hx of menorrhagia   . Labial abscess 01/2010  . Mastodynia 2008  . H/O varicella   . H/O measles   . CIN I (cervical intraepithelial neoplasia I) 2000  . Folliculitis 10/2001    Left  buttock  . Candida vaginitis 07/2004  . H/O amenorrhea 07/2004  . Purulent vaginitis 12/2004  . Monilial vaginitis 2002  . Hydradenitis 2012  . H/O oral aphthous ulcers   . H/O chest pain   . Hx of migraines   . Abnormal Pap smear  2004 & 2005    CRYO  LAST PAP 02/2011  . Infection 1997    CHLAMYDIA; TRICH  . Infection     OCC YEAST  . Headache     MIGRAINES WITH PREV PREG  . Fibroids 08/2010  . 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 SVD (spontaneous vaginal delivery)     x 1  . Anxiety     no meds    Past Surgical History  Procedure Date  . Gynecologic cryosurgery 05/1998  . Eye surgery 04/2011    STYE ON R EYE  . Dilation and evacuation 07/11/2011    Procedure: DILATATION AND EVACUATION;  Surgeon: Kirkland Hun, MD;  Location: [facility];  Service: Gynecology;  Laterality: N/A;    Family History  Problem Relation Age of Onset  . Cancer Paternal Grandfather     stomach  . Hearing loss Paternal Grandfather   . Cancer Maternal Grandmother     ovarian; STOMACH  . Aneurysm Maternal Grandfather     stomach; aneurysm  . Kidney disease Maternal Grandfather     Kidney Stones; FAILURE  . Hypertension Mother   . Hyperlipidemia Mother   . Diabetes Maternal Aunt   . Heart  disease Maternal Aunt   . Arthritis Maternal Aunt   . Heart disease Maternal Uncle     MI  . Depression Maternal Uncle   . Alcohol abuse Maternal Uncle   . Cancer Paternal Uncle     lung  . Hearing loss Paternal Uncle   . Hearing loss Father   . Hearing loss Sister   . Asthma Other   . Hearing loss Cousin     History  Substance Use Topics  . Smoking status: Never Smoker   . Smokeless tobacco: Never Used  . Alcohol Use: Yes     Comment: socially    Allergies: No Known Allergies  Prescriptions prior to admission  Medication Sig Dispense Refill  . loratadine (CLARITIN) 10 MG tablet Take 10 mg by mouth daily as needed. For allergy symptoms      . Prenatal Vit-Fe Fumarate-FA (MULTIVITAMIN-PRENATAL) 27-0.8 MG TABS Take 1 tablet by mouth daily.      . promethazine (PHENERGAN) 12.5 MG tablet Take 1 tablet (12.5 mg total) by mouth every 6 (six) hours as needed for nausea.  30 tablet  0    @ROS @ Physical Exam   Blood  pressure 133/91, pulse 98, temperature 98.5 F (36.9 C), temperature source Oral, resp. rate 18, height 5\' 4"  (1.626 m), weight 78.835 kg (173 lb 12.8 oz), last menstrual period 11/01/2010.  @PHYSEXAMBYAGE2 @ Labs:  Recent Results (from the past 24 hour(s))  POCT PREGNANCY, URINE   Collection Time   12/07/11  1:37 PM      Component Value Range   Preg Test, Ur NEGATIVE  NEGATIVE  CBC WITH DIFFERENTIAL   Collection Time   12/07/11  1:44 PM      Component Value Range   WBC 7.6  4.0 - 10.5 K/uL   RBC 4.22  3.87 - 5.11 MIL/uL   Hemoglobin 12.6  12.0 - 15.0 g/dL   HCT 16.1  09.6 - 04.5 %   MCV 89.8  78.0 - 100.0 fL   MCH 29.9  26.0 - 34.0 pg   MCHC 33.2  30.0 - 36.0 g/dL   RDW 40.9  81.1 - 91.4 %   Platelets 285  150 - 400 K/uL   Neutrophils Relative 59  43 - 77 %   Neutro Abs 4.5  1.7 - 7.7 K/uL   Lymphocytes Relative 28  12 - 46 %   Lymphs Abs 2.1  0.7 - 4.0 K/uL   Monocytes Relative 11  3 - 12 %   Monocytes Absolute 0.8  0.1 - 1.0 K/uL   Eosinophils Relative 1  0 - 5 %   Eosinophils Absolute 0.1  0.0 - 0.7 K/uL   Basophils Relative 0  0 - 1 %   Basophils Absolute 0.0  0.0 - 0.1 K/uL  HCG, QUANTITATIVE, PREGNANCY   Collection Time   12/07/11  1:44 PM      Component Value Range   hCG, Beta Chain, Quant, S 52 (*) <5 mIU/mL   ASSESSMENT: Patient Active Problem List  Diagnosis  . Fibroids  . Pelvic pain  . Hx: UTI (urinary tract infection)  . Hydradenitis  . Hx of menorrhagia  . First trimester bleeding  . Rh negative state in antepartum period   Physical Examination: Physical exam: Calm, no distress, bdomen nontender Normal hair distrubition mons pubis,  EGBUS WNL, sterile speculum exam,  vagina pink, moist normal rugae,  Vag LTC  With scan red mucus strands, no addendal mass no CMT  ED Course  Assessment/Plan [redacted]w[redacted]d RH neg [place] pending, GC/CHL wet prep Lavera Guise, CNM Addendum wet prep neg, rhopylac given, [place] no IUP  Discussed bleeding to report, f/o [facility]  Friday. Collaboration with Dr. Normand Sloop at [facility]. Lavera Guise, CNM

## 2011-12-08 LAB — RH IG WORKUP (INCLUDES ABO/RH)
ABO/RH(D): O NEG
Gestational Age(Wks): 5

## 2011-12-08 LAB — GC/CHLAMYDIA PROBE AMP, GENITAL
Chlamydia, DNA Probe: NEGATIVE
GC Probe Amp, Genital: NEGATIVE

## 2011-12-11 ENCOUNTER — Other Ambulatory Visit: Payer: Federal, State, Local not specified - PPO

## 2011-12-14 ENCOUNTER — Other Ambulatory Visit: Payer: Federal, State, Local not specified - PPO

## 2011-12-14 ENCOUNTER — Encounter: Payer: Self-pay | Admitting: Obstetrics and Gynecology

## 2011-12-14 DIAGNOSIS — O209 Hemorrhage in early pregnancy, unspecified: Secondary | ICD-10-CM

## 2011-12-17 ENCOUNTER — Telehealth: Payer: Self-pay

## 2011-12-17 NOTE — Telephone Encounter (Signed)
LM for pt to c/b regarding quants.

## 2011-12-17 NOTE — Telephone Encounter (Signed)
Message copied by Janeece Agee on Mon Dec 17, 2011  8:34 AM ------      Message from: Cornelius Moras      Created: Sat Dec 15, 2011  1:08 AM       [facility] resolved--no further labs needed.  Please inform patient.      VL

## 2011-12-17 NOTE — Telephone Encounter (Signed)
Spoke with pt informing her quants are longer needed. Pt will attempt to conceive again & requests [facility]. [facility] called to [facility] on [address] per protocol. Pt agrees and voices understanding.

## 2012-01-23 NOTE — L&D Delivery Note (Signed)
Delivery Note At 3:55 AM a viable female was delivered via Vaginal, Spontaneous Delivery (Presentation: ; Occiput Anterior).  Prolonged decel at crowning, Faculty called, midline episiotomy cut and baby born after a push.  APGAR: 8, 9; weight pending.  Placenta status: Intact, Spontaneous.  Cord: 3 vessels with the following complications: None.  Cord pH: collected but clotted.  Anesthesia: Local  Episiotomy: Median Lacerations: 2nd degree;Vaginal Suture Repair: 3.0 vicryl Est. Blood Loss (mL): 300  Mom to postpartum.  Baby to Couplet care / Skin to Skin.  Routine PP orders Breastfeeding Outpt circ  Jemar Paulsen 12/16/2012, 4:53 AM

## 2012-03-17 ENCOUNTER — Encounter: Payer: Self-pay | Admitting: Obstetrics and Gynecology

## 2012-03-17 NOTE — Progress Notes (Unsigned)
FAx received to [facility]. Rx for Prenatal Plus 1 po qd # 100 with 1 rf faxed to [place], [place] per protocol

## 2012-03-23 ENCOUNTER — Encounter (HOSPITAL_COMMUNITY): Payer: Self-pay | Admitting: *Deleted

## 2012-03-23 ENCOUNTER — Emergency Department (HOSPITAL_COMMUNITY)
Admission: EM | Admit: 2012-03-23 | Discharge: 2012-03-23 | Disposition: A | Discharge status: Home / Self Care | Payer: Federal, State, Local not specified - PPO | Attending: Emergency Medicine | Admitting: Emergency Medicine

## 2012-03-23 ENCOUNTER — Other Ambulatory Visit: Payer: Self-pay

## 2012-03-23 DIAGNOSIS — R209 Unspecified disturbances of skin sensation: Secondary | ICD-10-CM | POA: Insufficient documentation

## 2012-03-23 DIAGNOSIS — Z8719 Personal history of other diseases of the digestive system: Secondary | ICD-10-CM | POA: Insufficient documentation

## 2012-03-23 DIAGNOSIS — Z8742 Personal history of other diseases of the female genital tract: Secondary | ICD-10-CM | POA: Insufficient documentation

## 2012-03-23 DIAGNOSIS — R51 Headache: Secondary | ICD-10-CM

## 2012-03-23 DIAGNOSIS — H538 Other visual disturbances: Secondary | ICD-10-CM | POA: Insufficient documentation

## 2012-03-23 DIAGNOSIS — Z872 Personal history of diseases of the skin and subcutaneous tissue: Secondary | ICD-10-CM | POA: Insufficient documentation

## 2012-03-23 DIAGNOSIS — R200 Anesthesia of skin: Secondary | ICD-10-CM

## 2012-03-23 DIAGNOSIS — Z8619 Personal history of other infectious and parasitic diseases: Secondary | ICD-10-CM | POA: Insufficient documentation

## 2012-03-23 DIAGNOSIS — Z7982 Long term (current) use of aspirin: Secondary | ICD-10-CM | POA: Insufficient documentation

## 2012-03-23 DIAGNOSIS — Z8541 Personal history of malignant neoplasm of cervix uteri: Secondary | ICD-10-CM | POA: Insufficient documentation

## 2012-03-23 DIAGNOSIS — R202 Paresthesia of skin: Secondary | ICD-10-CM

## 2012-03-23 DIAGNOSIS — Z87440 Personal history of urinary (tract) infections: Secondary | ICD-10-CM | POA: Insufficient documentation

## 2012-03-23 DIAGNOSIS — Z8659 Personal history of other mental and behavioral disorders: Secondary | ICD-10-CM | POA: Insufficient documentation

## 2012-03-23 DIAGNOSIS — Z8679 Personal history of other diseases of the circulatory system: Secondary | ICD-10-CM | POA: Insufficient documentation

## 2012-03-23 LAB — COMPREHENSIVE METABOLIC PANEL
ALT: 11 U/L (ref 0–35)
AST: 16 U/L (ref 0–37)
Albumin: 4 g/dL (ref 3.5–5.2)
Alkaline Phosphatase: 83 U/L (ref 39–117)
BUN: 7 mg/dL (ref 6–23)
CO2: 25 mEq/L (ref 19–32)
Calcium: 9.3 mg/dL (ref 8.4–10.5)
Chloride: 103 mEq/L (ref 96–112)
Creatinine, Ser: 0.71 mg/dL (ref 0.50–1.10)
GFR calc Af Amer: >90 mL/min (ref >90)
GFR calc non Af Amer: >90 mL/min (ref >90)
Glucose, Bld: 89 mg/dL (ref 70–99)
Potassium: 3.3 mEq/L — ABNORMAL LOW (ref 3.5–5.1)
Sodium: 139 mEq/L (ref 135–145)
Total Bilirubin: 1.1 mg/dL (ref 0.3–1.2)
Total Protein: 8 g/dL (ref 6.0–8.3)

## 2012-03-23 LAB — CBC WITH DIFFERENTIAL/PLATELET
Basophils Absolute: 0 10*3/uL (ref 0.0–0.1)
Basophils Relative: 0 % (ref 0–1)
Eosinophils Absolute: 0 10*3/uL (ref 0.0–0.7)
Eosinophils Relative: 1 % (ref 0–5)
HCT: 40.8 % (ref 36.0–46.0)
Hemoglobin: 13.7 g/dL (ref 12.0–15.0)
Lymphocytes Relative: 43 % (ref 12–46)
Lymphs Abs: 1.9 10*3/uL (ref 0.7–4.0)
MCH: 30.6 pg (ref 26.0–34.0)
MCHC: 33.6 g/dL (ref 30.0–36.0)
MCV: 91.1 fL (ref 78.0–100.0)
Monocytes Absolute: 0.4 10*3/uL (ref 0.1–1.0)
Monocytes Relative: 9 % (ref 3–12)
Neutro Abs: 2.1 10*3/uL (ref 1.7–7.7)
Neutrophils Relative %: 47 % (ref 43–77)
Platelets: 253 10*3/uL (ref 150–400)
RBC: 4.48 MIL/uL (ref 3.87–5.11)
RDW: 12.6 % (ref 11.5–15.5)
WBC: 4.5 10*3/uL (ref 4.0–10.5)

## 2012-03-23 MED ORDER — POTASSIUM CHLORIDE CRYS ER 20 MEQ PO TBCR
20.0000 meq | EXTENDED_RELEASE_TABLET | Freq: Once | ORAL | Status: AC
  Administered 2012-03-23: 20 meq via ORAL
  Filled 2012-03-23: qty 1

## 2012-03-23 MED ORDER — LORAZEPAM 1 MG PO TABS
1.0000 mg | ORAL_TABLET | Freq: Once | ORAL | Status: AC
  Administered 2012-03-23: 1 mg via ORAL
  Filled 2012-03-23: qty 1

## 2012-03-23 NOTE — ED Notes (Signed)
MD at bedside. 

## 2012-03-23 NOTE — ED Provider Notes (Signed)
History    37 year old female with multiple complaints. Patient has been haing intermittent pressure in her left occipital region. Onset about 3 days ago. Patient states it feels like her "head is full." Scalp is tingling in the same area. She feels as if there is a "bump" there. She denies trauma. No fevers or chills. Intermittent dizziness which is not necessarily associated with this pressure sensation. Intermittent tingling in her right hand and right lower extremity. Generalized weakness. She denies any focalized weakness. No chest pain. Mild intermittent shortness of breath. Nausea, but no vomiting. Wart on her right little toe. No urinary complaints. No unusual vaginal bleeding or discharge.   CSN: [id number]  Arrival date & time 03/23/12  1159   First MD Initiated Contact with Patient 03/23/12 1319      Chief Complaint  Patient presents with  . Dizziness    (Consider location/radiation/quality/duration/timing/severity/associated sxs/prior treatment) HPI  Past Medical History  Diagnosis Date  . Pelvic pain 2012  . Hx: UTI (urinary tract infection)   . Hydradenitis   . Hx of menorrhagia   . Labial abscess 01/2010  . Mastodynia 2008  . H/O varicella   . H/O measles   . CIN I (cervical intraepithelial neoplasia I) 2000  . Folliculitis 10/2001    Left  buttock  . Candida vaginitis 07/2004  . H/O amenorrhea 07/2004  . Purulent vaginitis 12/2004  . Monilial vaginitis 2002  . Hydradenitis 2012  . H/O oral aphthous ulcers   . H/O chest pain   . Hx of migraines   . Abnormal Pap smear 2004 & 2005    CRYO  LAST PAP 02/2011  . Infection 1997    CHLAMYDIA; TRICH  . Infection     OCC YEAST  . Headache     MIGRAINES WITH PREV PREG  . Fibroids 08/2010  . 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 SVD (spontaneous vaginal delivery)     x 1  . Anxiety     no meds    Past Surgical History  Procedure Laterality Date  . Gynecologic cryosurgery  05/1998  . Eye surgery   04/2011    STYE ON R EYE  . Dilation and evacuation  07/11/2011    Procedure: DILATATION AND EVACUATION;  Surgeon: Kirkland Hun, MD;  Location: [facility];  Service: Gynecology;  Laterality: N/A;    Family History  Problem Relation Age of Onset  . Cancer Paternal Grandfather     stomach  . Hearing loss Paternal Grandfather   . Cancer Maternal Grandmother     ovarian; STOMACH  . Aneurysm Maternal Grandfather     stomach; aneurysm  . Kidney disease Maternal Grandfather     Kidney Stones; FAILURE  . Hypertension Mother   . Hyperlipidemia Mother   . Diabetes Maternal Aunt   . Heart disease Maternal Aunt   . Arthritis Maternal Aunt   . Heart disease Maternal Uncle     MI  . Depression Maternal Uncle   . Alcohol abuse Maternal Uncle   . Cancer Paternal Uncle     lung  . Hearing loss Paternal Uncle   . Hearing loss Father   . Hearing loss Sister   . Asthma Other   . Hearing loss Cousin     History  Substance Use Topics  . Smoking status: Never Smoker   . Smokeless tobacco: Never Used  . Alcohol Use: Yes     Comment: socially  OB History   Grav Para Term Preterm Abortions TAB SAB Ect Mult Living   3 1 1  1  1   1      Obstetric Comments   RECEIVED RHOGAM 2011      Review of Systems  All systems reviewed and negative, other than as noted in HPI.   Allergies  Review of patient's allergies indicates no known allergies.  Home Medications   Current Outpatient Rx  Name  Route  Sig  Dispense  Refill  . acetaminophen (TYLENOL) 500 MG tablet   Oral   Take 1,000 mg by mouth every 6 (six) hours as needed for pain.         Marland Kitchen aspirin 81 MG tablet   Oral   Take 324 mg by mouth 2 (two) times daily.         Marland Kitchen loratadine (CLARITIN) 10 MG tablet   Oral   Take 10 mg by mouth daily as needed for allergies. For allergy symptoms         . Prenatal Vit-Fe Fumarate-FA (PRENATAL MULTIVITAMIN) TABS   Oral   Take 1 tablet by mouth daily.           BP 155/96   Pulse 96  Temp(Src) 98.3 F (36.8 C)  Resp 18  SpO2 100%  LMP 11/01/2010  Breastfeeding? Unknown  Physical Exam  Nursing note and vitals reviewed. Constitutional: She is oriented to person, place, and time. She appears well-developed and well-nourished. No distress.  HENT:  Head: Normocephalic and atraumatic.  Right Ear: External ear normal.  Left Ear: External ear normal.  No lesions felt in the region that patient is complaining of a "bump."  Eyes: Conjunctivae and EOM are normal. Pupils are equal, round, and reactive to light. Right eye exhibits no discharge. Left eye exhibits no discharge.  Neck: Neck supple.  No nuchal rigidity  Cardiovascular: Normal rate, regular rhythm and normal heart sounds.  Exam reveals no gallop and no friction rub.   No murmur heard. Good DP pulses  Pulmonary/Chest: Effort normal and breath sounds normal. No respiratory distress.  Abdominal: Soft. She exhibits no distension. There is no tenderness.  Musculoskeletal: She exhibits no edema and no tenderness.  Neurological: She is alert and oriented to person, place, and time. No cranial nerve deficit. She exhibits normal muscle tone. Coordination normal.  Speech is clear. Content is appropriate. Good finger to nose testing bilaterally.  Skin: Skin is warm and dry. She is not diaphoretic.  Verrucous lesion to R fifth toe  Psychiatric: She has a normal mood and affect. Her behavior is normal. Thought content normal.    ED Course  Procedures (including critical care time)  Labs Reviewed  COMPREHENSIVE METABOLIC PANEL - Abnormal; Notable for the following:    Potassium 3.3 (*)    All other components within normal limits  CBC WITH DIFFERENTIAL   No results found.  EKG:  Rhythm: Normal sinus Vent. rate 97 BPM PR interval 150 ms QRS duration 82 ms QT/QTc 368/467 ms Abnormal R wave progression ST segments: Nonspecific ST changes in V2   1. Headache   2. Numbness and tingling       MDM   37 year old female with constellation of multiple vague, seemingly unrelated complaints. Her neurological examination is nonfocal. She is somewhat hypertensive, otherwise her vital signs are normal. She is in no acute distress. She clinically appears to be very well hydrated. Basic labs unremarkable aside from minimal hypokalemia. Likely unrelated to her presenting  complaints. EKG non-suggestive. Very low suspicion for emergent or urgent etiology of her complaints. History is appropriate for discharge at this time. Emergent return precautions were discussed. Outpatient followup otherwise.        Raeford Razor, MD 03/23/12 [phone]

## 2012-03-23 NOTE — ED Notes (Signed)
Pt has multiple complaints. Reports feeling lightheaded x 2 days, having head pressure yesterday and pain into her neck, has a "knot" to left side of neck, woke up from nap yesterday with numbness to right hand which has improved since, and "feels like circulation is messed up" in right leg. Ambulatory at triage, no neuro deficits noted, ekg done.

## 2012-03-23 NOTE — ED Notes (Signed)
Pt states yesterday she had pressure in the back of her head and she was lightheaded and "the circulation was off" in her right leg. Pt states the only complaint today is "a knot" in the back of her neck. NAD noted at this time. No neuro deficits noted at this time.

## 2012-05-26 LAB — OB RESULTS CONSOLE HIV ANTIBODY (ROUTINE TESTING): HIV: NONREACTIVE

## 2012-12-06 LAB — OB RESULTS CONSOLE GC/CHLAMYDIA
Chlamydia: NEGATIVE
Gonorrhea: NEGATIVE

## 2012-12-16 ENCOUNTER — Encounter (HOSPITAL_COMMUNITY): Payer: Self-pay | Admitting: Anesthesiology

## 2012-12-16 ENCOUNTER — Encounter (HOSPITAL_COMMUNITY): Payer: Self-pay | Admitting: *Deleted

## 2012-12-16 ENCOUNTER — Inpatient Hospital Stay (HOSPITAL_COMMUNITY)
Admission: AD | Admit: 2012-12-16 | Discharge: 2012-12-18 | DRG: 775 | Disposition: A | Discharge status: Home / Self Care | Payer: Federal, State, Local not specified - PPO | Source: Ambulatory Visit | Attending: Obstetrics and Gynecology | Admitting: Obstetrics and Gynecology

## 2012-12-16 DIAGNOSIS — H9190 Unspecified hearing loss, unspecified ear: Secondary | ICD-10-CM | POA: Diagnosis present

## 2012-12-16 DIAGNOSIS — IMO0002 Reserved for concepts with insufficient information to code with codable children: Secondary | ICD-10-CM | POA: Insufficient documentation

## 2012-12-16 DIAGNOSIS — IMO0001 Reserved for inherently not codable concepts without codable children: Secondary | ICD-10-CM

## 2012-12-16 DIAGNOSIS — G43909 Migraine, unspecified, not intractable, without status migrainosus: Secondary | ICD-10-CM | POA: Insufficient documentation

## 2012-12-16 DIAGNOSIS — O09529 Supervision of elderly multigravida, unspecified trimester: Secondary | ICD-10-CM | POA: Diagnosis present

## 2012-12-16 DIAGNOSIS — Z6791 Unspecified blood type, Rh negative: Secondary | ICD-10-CM | POA: Diagnosis present

## 2012-12-16 DIAGNOSIS — O26899 Other specified pregnancy related conditions, unspecified trimester: Secondary | ICD-10-CM | POA: Diagnosis present

## 2012-12-16 DIAGNOSIS — O26849 Uterine size-date discrepancy, unspecified trimester: Secondary | ICD-10-CM | POA: Insufficient documentation

## 2012-12-16 LAB — CBC
MCV: 91 fL (ref 78.0–100.0)
Platelets: 214 10*3/uL (ref 150–400)
RBC: 4.21 MIL/uL (ref 3.87–5.11)
RDW: 14 % (ref 11.5–15.5)
WBC: 8.4 10*3/uL (ref 4.0–10.5)

## 2012-12-16 LAB — RPR: RPR Ser Ql: NONREACTIVE

## 2012-12-16 LAB — POCT FERN TEST: POCT Fern Test: POSITIVE

## 2012-12-16 MED ORDER — DIPHENHYDRAMINE HCL 50 MG/ML IJ SOLN: 12.5000 mg | INTRAMUSCULAR | Status: DC | PRN

## 2012-12-16 MED ORDER — PHENYLEPHRINE 40 MCG/ML (10ML) SYRINGE FOR IV PUSH (FOR BLOOD PRESSURE SUPPORT)
80.0000 ug | PREFILLED_SYRINGE | INTRAVENOUS | Status: DC | PRN
  Filled 2012-12-16: qty 2

## 2012-12-16 MED ORDER — ACETAMINOPHEN 325 MG PO TABS: 650.0000 mg | ORAL_TABLET | ORAL | Status: DC | PRN

## 2012-12-16 MED ORDER — DIPHENHYDRAMINE HCL 25 MG PO CAPS: 25.0000 mg | ORAL_CAPSULE | Freq: Four times a day (QID) | ORAL | Status: DC | PRN

## 2012-12-16 MED ORDER — EPHEDRINE 5 MG/ML INJ
10.0000 mg | INTRAVENOUS | Status: DC | PRN
  Filled 2012-12-16: qty 2

## 2012-12-16 MED ORDER — BENZOCAINE-MENTHOL 20-0.5 % EX AERO
1.0000 "application " | INHALATION_SPRAY | CUTANEOUS | Status: DC | PRN
  Administered 2012-12-16 – 2012-12-18 (×2): 1 via TOPICAL
  Filled 2012-12-16 (×2): qty 56

## 2012-12-16 MED ORDER — PRENATAL MULTIVITAMIN CH
1.0000 | ORAL_TABLET | Freq: Every day | ORAL | Status: DC
  Administered 2012-12-16 – 2012-12-18 (×3): 1 via ORAL
  Filled 2012-12-16 (×3): qty 1

## 2012-12-16 MED ORDER — TETANUS-DIPHTH-ACELL PERTUSSIS 5-2.5-18.5 LF-MCG/0.5 IM SUSP: 0.5000 mL | Freq: Once | INTRAMUSCULAR | Status: DC

## 2012-12-16 MED ORDER — ONDANSETRON HCL 4 MG PO TABS: 4.0000 mg | ORAL_TABLET | ORAL | Status: DC | PRN

## 2012-12-16 MED ORDER — IBUPROFEN 600 MG PO TABS
600.0000 mg | ORAL_TABLET | Freq: Four times a day (QID) | ORAL | Status: DC
  Administered 2012-12-16 – 2012-12-18 (×9): 600 mg via ORAL
  Filled 2012-12-16 (×10): qty 1

## 2012-12-16 MED ORDER — OXYCODONE-ACETAMINOPHEN 5-325 MG PO TABS
1.0000 | ORAL_TABLET | ORAL | Status: DC | PRN
  Administered 2012-12-16: 1 via ORAL
  Filled 2012-12-16: qty 1

## 2012-12-16 MED ORDER — LANOLIN HYDROUS EX OINT: TOPICAL_OINTMENT | CUTANEOUS | Status: DC | PRN

## 2012-12-16 MED ORDER — ONDANSETRON HCL 4 MG/2ML IJ SOLN: 4.0000 mg | Freq: Four times a day (QID) | INTRAMUSCULAR | Status: DC | PRN

## 2012-12-16 MED ORDER — WITCH HAZEL-GLYCERIN EX PADS: 1.0000 "application " | MEDICATED_PAD | CUTANEOUS | Status: DC | PRN

## 2012-12-16 MED ORDER — IBUPROFEN 600 MG PO TABS
600.0000 mg | ORAL_TABLET | Freq: Four times a day (QID) | ORAL | Status: DC | PRN
  Administered 2012-12-16: 600 mg via ORAL
  Filled 2012-12-16: qty 1

## 2012-12-16 MED ORDER — LACTATED RINGERS IV SOLN: 500.0000 mL | Freq: Once | INTRAVENOUS | Status: DC

## 2012-12-16 MED ORDER — ONDANSETRON HCL 4 MG/2ML IJ SOLN: 4.0000 mg | INTRAMUSCULAR | Status: DC | PRN

## 2012-12-16 MED ORDER — FENTANYL CITRATE 0.05 MG/ML IJ SOLN: 100.0000 ug | INTRAMUSCULAR | Status: DC | PRN

## 2012-12-16 MED ORDER — LACTATED RINGERS IV SOLN
INTRAVENOUS | Status: DC
  Administered 2012-12-16: 01:00:00 via INTRAVENOUS

## 2012-12-16 MED ORDER — SIMETHICONE 80 MG PO CHEW: 80.0000 mg | CHEWABLE_TABLET | ORAL | Status: DC | PRN

## 2012-12-16 MED ORDER — SENNOSIDES-DOCUSATE SODIUM 8.6-50 MG PO TABS
2.0000 | ORAL_TABLET | ORAL | Status: DC
  Administered 2012-12-17 (×2): 2 via ORAL
  Filled 2012-12-16 (×2): qty 2

## 2012-12-16 MED ORDER — OXYTOCIN 40 UNITS IN LACTATED RINGERS INFUSION - SIMPLE MED
62.5000 mL/h | INTRAVENOUS | Status: DC
  Administered 2012-12-16: 62.5 mL/h via INTRAVENOUS
  Filled 2012-12-16: qty 1000

## 2012-12-16 MED ORDER — LIDOCAINE HCL (PF) 1 % IJ SOLN
30.0000 mL | INTRAMUSCULAR | Status: DC | PRN
  Administered 2012-12-16: 30 mL via SUBCUTANEOUS
  Filled 2012-12-16 (×2): qty 30

## 2012-12-16 MED ORDER — OXYTOCIN BOLUS FROM INFUSION: 500.0000 mL | INTRAVENOUS | Status: DC

## 2012-12-16 MED ORDER — CITRIC ACID-SODIUM CITRATE 334-500 MG/5ML PO SOLN: 30.0000 mL | ORAL | Status: DC | PRN

## 2012-12-16 MED ORDER — FENTANYL 2.5 MCG/ML BUPIVACAINE 1/10 % EPIDURAL INFUSION (WH - ANES): 14.0000 mL/h | INTRAMUSCULAR | Status: DC | PRN

## 2012-12-16 MED ORDER — ZOLPIDEM TARTRATE 5 MG PO TABS: 5.0000 mg | ORAL_TABLET | Freq: Every evening | ORAL | Status: DC | PRN

## 2012-12-16 MED ORDER — DIBUCAINE 1 % RE OINT: 1.0000 "application " | TOPICAL_OINTMENT | RECTAL | Status: DC | PRN

## 2012-12-16 MED ORDER — OXYCODONE-ACETAMINOPHEN 5-325 MG PO TABS: 1.0000 | ORAL_TABLET | ORAL | Status: DC | PRN

## 2012-12-16 MED ORDER — LACTATED RINGERS IV SOLN: 500.0000 mL | INTRAVENOUS | Status: DC | PRN

## 2012-12-16 NOTE — H&P (Signed)
Paula Perkins is a 37 y.o. female, [id number] at [redacted]w[redacted]d, presenting for SROM at 57 and active labor.  Denies VB, UCs, recent fever, resp or GI c/o's, UTI or PIH s/s. GFM.   Patient Active Problem List   Diagnosis Date Noted  . First trimester bleeding 06/27/2011  . Rh negative state in antepartum period 06/27/2011  . Fibroids   . Pelvic pain   . Hx: UTI (urinary tract infection)   . Hydradenitis   . Hx of menorrhagia     History of present pregnancy: Patient entered care at 6 weeks.   EDC of 12/27/12 was established by LMP.   Anatomy scan:  19 weeks, with normal findings and an posterior placenta. Small synechiae on the left lower uterine wall noted   Additional [place] evaluations:  [redacted]w[redacted]d for growth S>D - 80th%ile, AFI 65th%ile, vtx.   Significant prenatal events:  none   Last evaluation:  12/09/12 [redacted]w[redacted]d  OB History   Grav Para Term Preterm Abortions TAB SAB Ect Mult Living   4 1 1  1  1   1      Obstetric Comments   RECEIVED RHOGAM 2011     Past Medical History  Diagnosis Date  . Pelvic pain 2012  . Hx: UTI (urinary tract infection)   . Hydradenitis   . Hx of menorrhagia   . Labial abscess 01/2010  . Mastodynia 2008  . H/O varicella   . H/O measles   . CIN I (cervical intraepithelial neoplasia I) 2000  . Folliculitis 10/2001    Left  buttock  . Candida vaginitis 07/2004  . H/O amenorrhea 07/2004  . Purulent vaginitis 12/2004  . Monilial vaginitis 2002  . Hydradenitis 2012  . H/O oral aphthous ulcers   . H/O chest pain   . Hx of migraines   . Abnormal Pap smear 2004 & 2005    CRYO  LAST PAP 02/2011  . Infection 1997    CHLAMYDIA; TRICH  . Infection     OCC YEAST  . Headache(784.0)     MIGRAINES WITH PREV PREG  . Fibroids 08/2010  . 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 SVD (spontaneous vaginal delivery)     x 1  . Anxiety     no meds   Past Surgical History  Procedure Laterality Date  . Gynecologic cryosurgery  05/1998  . Eye surgery  04/2011     STYE ON R EYE  . Dilation and evacuation  07/11/2011    Procedure: DILATATION AND EVACUATION;  Surgeon: Kirkland Hun, MD;  Location: [facility];  Service: Gynecology;  Laterality: N/A;   Family History: family history includes Alcohol abuse in her maternal uncle; Aneurysm in her maternal grandfather; Arthritis in her maternal aunt; Asthma in her other; Cancer in her maternal grandmother, paternal grandfather, and paternal uncle; Depression in her maternal uncle; Diabetes in her maternal aunt; Hearing loss in her cousin, father, paternal grandfather, paternal uncle, and sister; Heart disease in her maternal aunt and maternal uncle; Hyperlipidemia in her mother; Hypertension in her mother; Kidney disease in her maternal grandfather. Social History:  reports that she has never smoked. She has never used smokeless tobacco. She reports that she drinks alcohol. She reports that she does not use illicit drugs.   Prenatal Transfer Tool  Maternal Diabetes: No Genetic Screening: Normal Maternal Ultrasounds/Referrals: Normal Fetal Ultrasounds or other Referrals:  None Maternal Substance Abuse:  No Significant Maternal Medications:  None Significant Maternal  Lab Results: Lab values include: Group B Strep negative    ROS: see HPI above, all other systems are negative   No Known Allergies   Dilation: 3 Effacement (%): 50 Station: -2 Exam by:: J. Palma Buster CNM Blood pressure 124/90, pulse 96, temperature 97.9 F (36.6 C), temperature source Oral, resp. rate 20, weight 200 lb (90.719 kg), unknown if currently breastfeeding.  Chest clear Heart RRR without murmur Abd gravid, NT Ext: WNL  FHR: Cat II UCs:  Difficult to trace  Prenatal labs: ABO, Rh:  O neg Antibody:  neg Rubella:   Immune RPR:   Neg HBsAg:   Neg HIV:   Neg GBS:  Neg Sickle cell/Hgb electrophoresis:  Normal study Pap:  09/04/12 WNL GC:  Neg Chlamydia:  Neg Genetic screenings:  Harmony Normal Glucola:  123 Other:   none   Assessment/Plan: IUP at [redacted]w[redacted]d Active labor with SROM GBS neg  Admit BS per c/w Dr. Normand Sloop as attending MD Routine labor orders Epidural prn if desired Rhogam PP  Rowan Blase, MSN 12/16/2012, 1:26 AM

## 2012-12-16 NOTE — MAU Note (Signed)
Water broke at 1045. Clear fluid. Contractions every 5-6 minutes.

## 2012-12-16 NOTE — Anesthesia Preprocedure Evaluation (Deleted)
Anesthesia Evaluation    Airway       Dental   Pulmonary          Cardiovascular     Neuro/Psych    GI/Hepatic   Endo/Other    Renal/GU      Musculoskeletal   Abdominal   Peds  Hematology   Anesthesia Other Findings   Reproductive/Obstetrics                          Anesthesia Physical Anesthesia Plan  ASA:   Anesthesia Plan:    Post-op Pain Management:    Induction:   Airway Management Planned:   Additional Equipment:   Intra-op Plan:   Post-operative Plan:   Informed Consent:   Plan Discussed with: Anesthesiologist  Anesthesia Plan Comments: (Epidural catheter never placed. Chart reviewed.)       Anesthesia Quick Evaluation

## 2012-12-17 LAB — CBC
HCT: 36.3 % (ref 36.0–46.0)
Hemoglobin: 12.1 g/dL (ref 12.0–15.0)
Platelets: 207 10*3/uL (ref 150–400)
RDW: 14.3 % (ref 11.5–15.5)
WBC: 11 10*3/uL — ABNORMAL HIGH (ref 4.0–10.5)

## 2012-12-17 MED ORDER — RHO D IMMUNE GLOBULIN 1500 UNIT/2ML IJ SOLN
300.0000 ug | Freq: Once | INTRAMUSCULAR | Status: AC
  Administered 2012-12-17: 300 ug via INTRAMUSCULAR
  Filled 2012-12-17: qty 2

## 2012-12-17 NOTE — Progress Notes (Signed)

## 2012-12-17 NOTE — Lactation Note (Signed)
This note was copied from the chart of Paula Manufacturing engineer. Lactation Consultation Note  Patient Name: Paula Perkins [id number] Date: 12/17/2012   [facility] reviewed baby's feeding record and spoke with mother/baby nurse, Gaylyn Rong who reports most recent [facility] score "8" and mom nursing well on cue.  Baby's output is above minimum for this hour of life (43 hours) and has breastfed for 10-35 minutes per feeding at most feedings since birth.  Mom will be seen prior to discharge tomorrow so LC deferred visit tonight.  Maternal Data    Feeding Feeding Type: Breast Fed Length of feed: 25 min  LATCH Score/Interventions Latch: Grasps breast easily, tongue down, lips flanged, rhythmical sucking.  Audible Swallowing: A few with stimulation  Type of Nipple: Everted at rest and after stimulation  Comfort (Breast/Nipple): Soft / non-tender     Hold (Positioning): Assistance needed to correctly position infant at breast and maintain latch.  LATCH Score: 8   Lactation Tools Discussed/Used   N/A - no LC visit   Consult Status   LC to see in am   Lynda Rainwater 12/17/2012, 10:59 PM

## 2012-12-17 NOTE — Progress Notes (Signed)
Post Partum Day 1 Subjective: no complaints, up ad lib, voiding and tolerating PO  Objective: Blood pressure 116/76, pulse 77, temperature 98.4 F (36.9 C), temperature source Oral, resp. rate 18, height 5\' 4"  (1.626 m), weight 200 lb (90.719 kg), SpO2 97.00%, unknown if currently breastfeeding.  Physical Exam:  General: alert, cooperative and no distress Lochia: appropriate Uterine Fundus: firm Incision: n/a DVT Evaluation: No evidence of DVT seen on physical exam.   Recent Labs  12/16/12 0124 12/17/12 0600  HGB 13.0 12.1  HCT 38.3 36.3    Assessment/Plan: Plan for discharge tomorrow   LOS: 1 day   Jozelynn Danielson P 12/17/2012, 9:28 AM

## 2012-12-18 LAB — RH IG WORKUP (INCLUDES ABO/RH)
ABO/RH(D): O NEG
DAT, IgG: NEGATIVE
Fetal Screen: NEGATIVE

## 2012-12-18 MED ORDER — IBUPROFEN 600 MG PO TABS: 600.0000 mg | ORAL_TABLET | Freq: Four times a day (QID) | ORAL | Status: DC

## 2012-12-18 NOTE — Discharge Summary (Signed)
Vaginal Delivery Discharge Summary  Paula Perkins  DOB:    12-13-1975 MRN:    [id number] CSN:    [id number]  Date of admission:                  12/16/12  Date of discharge:                   12/18/2012  Procedures this admission: SVD by J.Beckey Downing, CNM  Date of Delivery: 12/16/12   Newborn Data:  Live born female  Birth Weight: 7 lb 11.6 oz (3505 g) APGAR: 8, 9  Home with mother.    History of Present Illness:  Paula Perkins is a 37 y.o. female, [phone], who presents at [redacted]w[redacted]d weeks gestation. The patient has been followed at the [facility] division of [facility]. She was admitted onset of labor. Her pregnancy has been complicated by: none.  Hospital course:  The patient was admitted for active labor.   Her labor was not complicated. She proceeded to have a vaginal delivery of a healthy infant. Her delivery was not complicated. Her postpartum course was not complicated.  She was discharged to home on postpartum day 2 doing well.  Feeding:  breast  Contraception:  condoms  Discharge hemoglobin:  Hemoglobin  Date Value Range Status  12/17/2012 12.1  12.0 - 15.0 g/dL Final     HCT  Date Value Range Status  12/17/2012 36.3  36.0 - 46.0 % Final    Discharge Physical Exam:   General: alert and no distress Lochia: appropriate Uterine Fundus: firm Incision: healing well DVT Evaluation: No evidence of DVT seen on physical exam. Negative Homan's sign. No significant calf/ankle edema.  Intrapartum Procedures: spontaneous vaginal delivery Postpartum Procedures: none Complications-Operative and Postpartum: none  Discharge Diagnoses: Term Pregnancy-delivered  Discharge Information:  Activity:           pelvic rest Diet:                routine Medications: PNV and Ibuprofen Condition:      stable Instructions:   Postpartum Care After Vaginal Delivery  After you deliver your newborn  (postpartum period), the usual stay in the hospital is 24 72 hours. If there were problems with your labor or delivery, or if you have other medical problems, you might be in the hospital longer.  While you are in the hospital, you will receive help and instructions on how to care for yourself and your newborn during the postpartum period.  While you are in the hospital:  Be sure to tell your nurses if you have pain or discomfort, as well as where you feel the pain and what makes the pain worse.  If you had an incision made near your vagina (episiotomy) or if you had some tearing during delivery, the nurses may put ice packs on your episiotomy or tear. The ice packs may help to reduce the pain and swelling.  If you are breastfeeding, you may feel uncomfortable contractions of your uterus for a couple of weeks. This is normal. The contractions help your uterus get back to normal size.  It is normal to have some bleeding after delivery.  For the first 1 3 days after delivery, the flow is red and the amount may be similar to a period.  It is common for the flow to start and stop.  In the first few days, you may pass some small clots. Let your nurses  know if you begin to pass large clots or your flow increases.  Do not  flush blood clots down the toilet before having the nurse look at them.  During the next 3 10 days after delivery, your flow should become more watery and pink or brown-tinged in color.  Ten to fourteen days after delivery, your flow should be a small amount of yellowish-white discharge.  The amount of your flow will decrease over the first few weeks after delivery. Your flow may stop in 6 8 weeks. Most women have had their flow stop by 12 weeks after delivery.  You should change your sanitary pads frequently.  Wash your hands thoroughly with soap and water for at least 20 seconds after changing pads, using the toilet, or before holding or feeding your newborn.  You should  feel like you need to empty your bladder within the first 6 8 hours after delivery.  In case you become weak, lightheaded, or faint, call your nurse before you get out of bed for the first time and before you take a shower for the first time.  Within the first few days after delivery, your breasts may begin to feel tender and full. This is called engorgement. Breast tenderness usually goes away within 48 72 hours after engorgement occurs. You may also notice milk leaking from your breasts. If you are not breastfeeding, do not stimulate your breasts. Breast stimulation can make your breasts produce more milk.  Spending as much time as possible with your newborn is very important. During this time, you and your newborn can feel close and get to know each other. Having your newborn stay in your room (rooming in) will help to strengthen the bond with your newborn. It will give you time to get to know your newborn and become comfortable caring for your newborn.  Your hormones change after delivery. Sometimes the hormone changes can temporarily cause you to feel sad or tearful. These feelings should not last more than a few days. If these feelings last longer than that, you should talk to your caregiver.  If desired, talk to your caregiver about methods of family planning or contraception.  Talk to your caregiver about immunizations. Your caregiver may want you to have the following immunizations before leaving the hospital:  Tetanus, diphtheria, and pertussis (Tdap) or tetanus and diphtheria (Td) immunization. It is very important that you and your family (including grandparents) or others caring for your newborn are up-to-date with the Tdap or Td immunizations. The Tdap or Td immunization can help protect your newborn from getting ill.  Rubella immunization.  Varicella (chickenpox) immunization.  Influenza immunization. You should receive this annual immunization if you did not receive the  immunization during your pregnancy. Document Released: 11/05/2006 Document Revised: 10/03/2011 Document Reviewed: 09/05/2011 [employer] [facility] Patient Information 2014 [place], [place].   Postpartum Depression and Baby Blues  The postpartum period begins right after the birth of a baby. During this time, there is often a great amount of joy and excitement. It is also a time of considerable changes in the life of the parent(s). Regardless of how many times a mother gives birth, each child brings new challenges and dynamics to the family. It is not unusual to have feelings of excitement accompanied by confusing shifts in moods, emotions, and thoughts. All mothers are at risk of developing postpartum depression or the "baby blues." These mood changes can occur right after giving birth, or they may occur many months after giving birth. The baby  blues or postpartum depression can be mild or severe. Additionally, postpartum depression can resolve rather quickly, or it can be a long-term condition. CAUSES Elevated hormones and their rapid decline are thought to be a main cause of postpartum depression and the baby blues. There are a number of hormones that radically change during and after pregnancy. Estrogen and progesterone usually decrease immediately after delivering your baby. The level of thyroid hormone and various cortisol steroids also rapidly drop. Other factors that play a major role in these changes include major life events and genetics.  RISK FACTORS If you have any of the following risks for the baby blues or postpartum depression, know what symptoms to watch out for during the postpartum period. Risk factors that may increase the likelihood of getting the baby blues or postpartum depression include:  Havinga personal or family history of depression.  Having depression while being pregnant.  Having premenstrual or oral contraceptive-associated mood issues.  Having exceptional life stress.  Having  marital conflict.  Lacking a social support network.  Having a baby with special needs.  Having health problems such as diabetes. SYMPTOMS Baby blues symptoms include:  Brief fluctuations in mood, such as going from extreme happiness to sadness.  Decreased concentration.  Difficulty sleeping.  Crying spells, tearfulness.  Irritability.  Anxiety. Postpartum depression symptoms typically begin within the first month after giving birth. These symptoms include:  Difficulty sleeping or excessive sleepiness.  Marked weight loss.  Agitation.  Feelings of worthlessness.  Lack of interest in activity or food. Postpartum psychosis is a very concerning condition and can be dangerous. Fortunately, it is rare. Displaying any of the following symptoms is cause for immediate medical attention. Postpartum psychosis symptoms include:  Hallucinations and delusions.  Bizarre or disorganized behavior.  Confusion or disorientation. DIAGNOSIS  A diagnosis is made by an evaluation of your symptoms. There are no medical or lab tests that lead to a diagnosis, but there are various questionnaires that a caregiver may use to identify those with the baby blues, postpartum depression, or psychosis. Often times, a screening tool called the [place] Postnatal Depression Scale is used to diagnose depression in the postpartum period.  TREATMENT The baby blues usually goes away on its own in 1 to 2 weeks. Social support is often all that is needed. You should be encouraged to get adequate sleep and rest. Occasionally, you may be given medicines to help you sleep.  Postpartum depression requires treatment as it can last several months or longer if it is not treated. Treatment may include individual or group therapy, medicine, or both to address any social, physiological, and psychological factors that may play a role in the depression. Regular exercise, a healthy diet, rest, and social support may also be  strongly recommended.  Postpartum psychosis is more serious and needs treatment right away. Hospitalization is often needed. HOME CARE INSTRUCTIONS  Get as much rest as you can. Nap when the baby sleeps.  Exercise regularly. Some women find yoga and walking to be beneficial.  Eat a balanced and nourishing diet.  Do little things that you enjoy. Have a cup of tea, take a bubble bath, read your favorite magazine, or listen to your favorite music.  Avoid alcohol.  Ask for help with household chores, cooking, grocery shopping, or running errands as needed. Do not try to do everything.  Talk to people close to you about how you are feeling. Get support from your partner, family members, friends, or other new moms.  Try to stay positive in how you think. Think about the things you are grateful for.  Do not spend a lot of time alone.  Only take medicine as directed by your caregiver.  Keep all your postpartum appointments.  Let your caregiver know if you have any concerns. SEEK MEDICAL CARE IF: You are having a reaction or problems with your medicine. SEEK IMMEDIATE MEDICAL CARE IF:  You have suicidal feelings.  You feel you may harm the baby or someone else. Document Released: 10/13/2003 Document Revised: 04/02/2011 Document Reviewed: 11/14/2010 [facility] Patient Information 2014 [place], [place].   Discharge to: home  Follow-up Information   Follow up with [facility] In 6 weeks.   Specialty:  Obstetrics and Gynecology   Contact information:   [address] [place] [address] [phone]       Malissa Hippo 12/18/2012

## 2012-12-18 NOTE — Lactation Note (Signed)
This note was copied from the chart of Paula Manufacturing engineer. Lactation Consultation Note  Patient Name: Paula Perkins OZHYQ'M Date: 12/18/2012 Reason for consult: Follow-up assessment Assisted Mom with obtaining more depth with latch at this visit, baby demonstrates a good rhythmic suck with swallows noted. Basics reviewed. Engorgement care reviewed. Advised of OP services and support group.   Maternal Data    Feeding Feeding Type: Breast Fed  LATCH Score/Interventions Latch: Grasps breast easily, tongue down, lips flanged, rhythmical sucking. Intervention(s): Assist with latch;Adjust position;Breast massage;Breast compression  Audible Swallowing: Spontaneous and intermittent  Type of Nipple: Everted at rest and after stimulation  Comfort (Breast/Nipple): Soft / non-tender     Hold (Positioning): Assistance needed to correctly position infant at breast and maintain latch. Intervention(s): Breastfeeding basics reviewed;Support Pillows;Position options;Skin to skin  LATCH Score: 9  Lactation Tools Discussed/Used     Consult Status Consult Status: Complete    Alfred Levins 12/18/2012, 10:26 AM

## 2013-10-09 ENCOUNTER — Other Ambulatory Visit: Payer: Self-pay | Admitting: Obstetrics and Gynecology

## 2013-10-09 DIAGNOSIS — E041 Nontoxic single thyroid nodule: Secondary | ICD-10-CM

## 2013-10-13 ENCOUNTER — Ambulatory Visit
Admission: RE | Admit: 2013-10-13 | Discharge: 2013-10-13 | Disposition: A | Discharge status: Home / Self Care | Payer: Federal, State, Local not specified - PPO | Source: Ambulatory Visit | Attending: Obstetrics and Gynecology | Admitting: Obstetrics and Gynecology

## 2013-10-13 DIAGNOSIS — E041 Nontoxic single thyroid nodule: Secondary | ICD-10-CM

## 2013-11-23 ENCOUNTER — Encounter (HOSPITAL_COMMUNITY): Payer: Self-pay | Admitting: *Deleted

## 2014-03-31 ENCOUNTER — Other Ambulatory Visit: Payer: Self-pay | Admitting: Endocrinology

## 2014-03-31 DIAGNOSIS — E042 Nontoxic multinodular goiter: Secondary | ICD-10-CM

## 2014-04-28 ENCOUNTER — Inpatient Hospital Stay: Admission: RE | Admit: 2014-04-28 | Payer: Federal, State, Local not specified - PPO | Source: Ambulatory Visit

## 2014-05-31 ENCOUNTER — Other Ambulatory Visit: Payer: Federal, State, Local not specified - PPO

## 2014-06-01 ENCOUNTER — Other Ambulatory Visit: Payer: Federal, State, Local not specified - PPO

## 2014-06-02 ENCOUNTER — Ambulatory Visit
Admission: RE | Admit: 2014-06-02 | Discharge: 2014-06-02 | Disposition: A | Discharge status: Home / Self Care | Payer: Federal, State, Local not specified - PPO | Source: Ambulatory Visit | Attending: Endocrinology | Admitting: Endocrinology

## 2014-06-02 DIAGNOSIS — E042 Nontoxic multinodular goiter: Secondary | ICD-10-CM

## 2014-12-02 LAB — OB RESULTS CONSOLE RPR: RPR: NONREACTIVE

## 2014-12-02 LAB — OB RESULTS CONSOLE GC/CHLAMYDIA
Chlamydia: NEGATIVE
Gonorrhea: NEGATIVE

## 2014-12-02 LAB — OB RESULTS CONSOLE ANTIBODY SCREEN: Antibody Screen: NEGATIVE

## 2014-12-02 LAB — OB RESULTS CONSOLE ABO/RH: RH TYPE: NEGATIVE

## 2014-12-02 LAB — OB RESULTS CONSOLE HIV ANTIBODY (ROUTINE TESTING): HIV: NONREACTIVE

## 2014-12-02 LAB — OB RESULTS CONSOLE HEPATITIS B SURFACE ANTIGEN: Hepatitis B Surface Ag: NEGATIVE

## 2014-12-02 LAB — OB RESULTS CONSOLE RUBELLA ANTIBODY, IGM: Rubella: IMMUNE

## 2014-12-04 ENCOUNTER — Inpatient Hospital Stay (HOSPITAL_COMMUNITY): Payer: Federal, State, Local not specified - PPO

## 2014-12-04 ENCOUNTER — Inpatient Hospital Stay (HOSPITAL_COMMUNITY)
Admission: AD | Admit: 2014-12-04 | Discharge: 2014-12-04 | Disposition: A | Discharge status: Home / Self Care | Payer: Federal, State, Local not specified - PPO | Source: Ambulatory Visit | Attending: Obstetrics and Gynecology | Admitting: Obstetrics and Gynecology

## 2014-12-04 ENCOUNTER — Encounter (HOSPITAL_COMMUNITY): Payer: Self-pay | Admitting: *Deleted

## 2014-12-04 DIAGNOSIS — O209 Hemorrhage in early pregnancy, unspecified: Secondary | ICD-10-CM

## 2014-12-04 DIAGNOSIS — O26891 Other specified pregnancy related conditions, first trimester: Secondary | ICD-10-CM | POA: Diagnosis not present

## 2014-12-04 DIAGNOSIS — Z3A01 Less than 8 weeks gestation of pregnancy: Secondary | ICD-10-CM | POA: Diagnosis not present

## 2014-12-04 DIAGNOSIS — Z6791 Unspecified blood type, Rh negative: Secondary | ICD-10-CM | POA: Insufficient documentation

## 2014-12-04 HISTORY — DX: Nontoxic multinodular goiter: E04.2

## 2014-12-04 HISTORY — DX: Gestational (pregnancy-induced) hypertension without significant proteinuria, unspecified trimester: O13.9

## 2014-12-04 LAB — URINE MICROSCOPIC-ADD ON

## 2014-12-04 LAB — URINALYSIS, ROUTINE W REFLEX MICROSCOPIC
Bilirubin Urine: NEGATIVE
Glucose, UA: NEGATIVE mg/dL
Ketones, ur: NEGATIVE mg/dL
NITRITE: NEGATIVE
PROTEIN: NEGATIVE mg/dL
Specific Gravity, Urine: 1.015 (ref 1.005–1.030)
UROBILINOGEN UA: 0.2 mg/dL (ref 0.0–1.0)
pH: 7.5 (ref 5.0–8.0)

## 2014-12-04 LAB — WET PREP, GENITAL
CLUE CELLS WET PREP: NONE SEEN
TRICH WET PREP: NONE SEEN
YEAST WET PREP: NONE SEEN

## 2014-12-04 LAB — CBC
HEMATOCRIT: 39.1 % (ref 36.0–46.0)
HEMOGLOBIN: 13.1 g/dL (ref 12.0–15.0)
MCH: 30.9 pg (ref 26.0–34.0)
MCHC: 33.5 g/dL (ref 30.0–36.0)
MCV: 92.2 fL (ref 78.0–100.0)
Platelets: 250 10*3/uL (ref 150–400)
RBC: 4.24 MIL/uL (ref 3.87–5.11)
RDW: 13.1 % (ref 11.5–15.5)
WBC: 5.7 10*3/uL (ref 4.0–10.5)

## 2014-12-04 LAB — HCG, QUANTITATIVE, PREGNANCY: hCG, Beta Chain, Quant, S: 77758 m[IU]/mL — ABNORMAL HIGH (ref <5)

## 2014-12-04 MED ORDER — RHO D IMMUNE GLOBULIN 1500 UNIT/2ML IJ SOSY
300.0000 ug | PREFILLED_SYRINGE | Freq: Once | INTRAMUSCULAR | Status: AC
  Administered 2014-12-04: 300 ug via INTRAMUSCULAR
  Filled 2014-12-04: qty 2

## 2014-12-04 NOTE — Discharge Instructions (Signed)
Abnormal Uterine Bleeding Abnormal uterine bleeding can affect women at various stages in life, including teenagers, women in their reproductive years, pregnant women, and women who have reached menopause. Several kinds of uterine bleeding are considered abnormal, including:  Bleeding or spotting between periods.   Bleeding after sexual intercourse.   Bleeding that is heavier or more than normal.   Periods that last longer than usual.  Bleeding after menopause.  Many cases of abnormal uterine bleeding are minor and simple to treat, while others are more serious. Any type of abnormal bleeding should be evaluated by your health care provider. Treatment will depend on the cause of the bleeding. HOME CARE INSTRUCTIONS Monitor your condition for any changes. The following actions may help to alleviate any discomfort you are experiencing:  Avoid the use of tampons and douches as directed by your health care provider.  Change your pads frequently. You should get regular pelvic exams and Pap tests. Keep all follow-up appointments for diagnostic tests as directed by your health care provider.  SEEK MEDICAL CARE IF:   Your bleeding lasts more than 1 week.   You feel dizzy at times.  SEEK IMMEDIATE MEDICAL CARE IF:   You pass out.   You are changing pads every 15 to 30 minutes.   You have abdominal pain.  You have a fever.   You become sweaty or weak.   You are passing large blood clots from the vagina.   You start to feel nauseous and vomit. MAKE SURE YOU:   Understand these instructions.  Will watch your condition.  Will get help right away if you are not doing well or get worse.   This information is not intended to replace advice given to you by your health care provider. Make sure you discuss any questions you have with your health care provider.   Document Released: 01/08/2005 Document Revised: 01/13/2013 Document Reviewed: 08/07/2012 Elsevier Interactive  Patient Education 2016 [place]. Pelvic Rest Pelvic rest is sometimes recommended for women when:   The placenta is partially or completely covering the opening of the cervix (placenta previa).  There is bleeding between the uterine wall and the amniotic sac in the first trimester (subchorionic hemorrhage).  The cervix begins to open without labor starting (incompetent cervix, cervical insufficiency).  The labor is too early (preterm labor). HOME CARE INSTRUCTIONS  Do not have sexual intercourse, stimulation, or an orgasm.  Do not use tampons, douche, or put anything in the vagina.  Do not lift anything over 10 pounds (4.5 kg).  Avoid strenuous activity or straining your pelvic muscles. SEEK MEDICAL CARE IF:  You have any vaginal bleeding during pregnancy. Treat this as a potential emergency.  You have cramping pain felt low in the stomach (stronger than menstrual cramps).  You notice vaginal discharge (watery, mucus, or bloody).  You have a low, dull backache.  There are regular contractions or uterine tightening. SEEK IMMEDIATE MEDICAL CARE IF: You have vaginal bleeding and have placenta previa.    This information is not intended to replace advice given to you by your health care provider. Make sure you discuss any questions you have with your health care provider.   Document Released: 05/05/2010 Document Revised: 04/02/2011 Document Reviewed: 07/12/2014 Elsevier Interactive Patient Education [employer].

## 2014-12-04 NOTE — MAU Note (Signed)
First OB appt on Thurs, started spotting yesterday, had a little cramping yesterday- none today.

## 2014-12-04 NOTE — MAU Provider Note (Signed)
Paula Perkins is a 39 y.o. [id number] at 6 weeks by LMP.  Pt present to MAU c/o spotting this morning.  She has a hx of 2 SAB's and is Rh negative.  She has a dating [place] next week.  Labs were drawn in the office the other day but no result are posted.  Pt report she last had sexual intercourse 1 month ago.  Pap in 2015 negative.   History     Patient Active Problem List   Diagnosis Date Noted  . Vaginal delivery 12/17/2012  . Migraine 12/16/2012  . Advanced maternal age (AMA) in pregnancy 12/16/2012  . Rh negative status during pregnancy 12/16/2012  . Uterine size date discrepancy 12/16/2012  . Hearing deficit 12/16/2012  . First trimester bleeding 06/27/2011  . Rh negative state in antepartum period 06/27/2011  . Fibroids   . Pelvic pain   . Hx: UTI (urinary tract infection)   . Hydradenitis   . Hx of menorrhagia     Chief Complaint  Patient presents with  . Vaginal Bleeding   HPI  OB History    Gravida Para Term Preterm AB TAB SAB Ectopic Multiple Living   5 2 2  2  2   2       Obstetric Comments   RECEIVED RHOGAM 2011      Past Medical History  Diagnosis Date  . Pelvic pain 2012  . Hx: UTI (urinary tract infection)   . Hydradenitis   . Hx of menorrhagia   . Labial abscess 01/2010  . Mastodynia 2008  . H/O varicella   . H/O measles   . CIN I (cervical intraepithelial neoplasia I) 2000  . Folliculitis [id number]    Left  buttock  . Candida vaginitis 07/2004  . H/O amenorrhea 07/2004  . Purulent vaginitis 12/2004  . Monilial vaginitis 2002  . Hydradenitis 2012  . H/O oral aphthous ulcers   . H/O chest pain   . Hx of migraines   . Abnormal Pap smear 2004 & 2005    CRYO  LAST PAP 02/2011  . Headache(784.0)     MIGRAINES WITH PREV PREG  . Fibroids 08/2010  . 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 SVD (spontaneous vaginal delivery)     x 1  . Anxiety     no meds  . Pregnancy induced hypertension     after 2nd delivery  . Multiple thyroid  nodules   . Infection 1997    CHLAMYDIA; [place]  . Infection     OCC YEAST    Past Surgical History  Procedure Laterality Date  . Gynecologic cryosurgery  05/1998  . Eye surgery  04/2011    STYE ON R EYE  . Dilation and evacuation  07/11/2011    Procedure: DILATATION AND EVACUATION;  Surgeon: Ena Dawley, MD;  Location: [facility];  Service: Gynecology;  Laterality: N/A;  . Dilation and curettage of uterus      Family History  Problem Relation Age of Onset  . Cancer Paternal Grandfather     stomach  . Hearing loss Paternal Grandfather   . Cancer Maternal Grandmother     ovarian; STOMACH  . Aneurysm Maternal Grandfather     stomach; aneurysm  . Kidney disease Maternal Grandfather     Kidney Stones; FAILURE  . Hypertension Mother   . Hyperlipidemia Mother   . Diabetes Maternal Aunt   . Heart disease Maternal Aunt   . Arthritis Maternal Aunt   .  Heart disease Maternal Uncle     MI  . Depression Maternal Uncle   . Alcohol abuse Maternal Uncle   . Cancer Paternal Uncle     lung  . Hearing loss Paternal Uncle   . Hearing loss Father   . Hearing loss Sister   . Asthma Other   . Hearing loss Cousin     Social History  Substance Use Topics  . Smoking status: Never Smoker   . Smokeless tobacco: Never Used  . Alcohol Use: Yes     Comment: socially    Allergies: No Known Allergies  Prescriptions prior to admission  Medication Sig Dispense Refill Last Dose  . acetaminophen (TYLENOL) 500 MG tablet Take 1,000 mg by mouth every 6 (six) hours as needed for pain.   Past Week at Unknown time  . ibuprofen (ADVIL,MOTRIN) 600 MG tablet Take 1 tablet (600 mg total) by mouth every 6 (six) hours. 30 tablet 0   . Prenatal Vit-Fe Fumarate-FA (PRENATAL MULTIVITAMIN) TABS Take 1 tablet by mouth daily.   Past Week at Unknown time    ROS See HPI above, all other systems are negative  Physical Exam   Blood pressure 141/84, pulse 90, temperature 98 F (36.7 C), temperature source Oral,  resp. rate 18, height 5\' 3"  (1.6 m), weight 177 lb 6.4 oz (80.468 kg), last menstrual period 10/15/2014, unknown if currently breastfeeding.  Physical Exam Ext:  WNL ABD: Soft, non tender to palpation, no rebound or guarding SSE: cervix visually closed, no blood in the vault.  2cm red bruise/abraision appreciated at approximated 2 O'clock that started to bleed when touched  White creamy discharged observed    ED Course  Assessment: IUP at  6 weeks by LMP Membranes: intact FHR: n/a   Plan:  Labs: CBC, quant, wetprep, GC/CT [place] Will consult with Dr. Landry Mellow after results are received   Lev Cervone, CNM, MSN 12/04/2014. 9:32 AM    MAU Addendum Note  Results for orders placed or performed during the hospital encounter of 12/04/14 (from the past 24 hour(s))  Urinalysis, Routine w reflex microscopic (not at [facility])     Status: Abnormal   Collection Time: 12/04/14  9:20 AM  Result Value Ref Range   Color, Urine YELLOW YELLOW   APPearance CLEAR CLEAR   Specific Gravity, Urine 1.015 1.005 - 1.030   pH 7.5 5.0 - 8.0   Glucose, UA NEGATIVE NEGATIVE mg/dL   Hgb urine dipstick TRACE (A) NEGATIVE   Bilirubin Urine NEGATIVE NEGATIVE   Ketones, ur NEGATIVE NEGATIVE mg/dL   Protein, ur NEGATIVE NEGATIVE mg/dL   Urobilinogen, UA 0.2 0.0 - 1.0 mg/dL   Nitrite NEGATIVE NEGATIVE   Leukocytes, UA SMALL (A) NEGATIVE  Urine microscopic-add on     Status: Abnormal   Collection Time: 12/04/14  9:20 AM  Result Value Ref Range   Squamous Epithelial / LPF FEW (A) RARE   WBC, UA 0-2 <3 WBC/hpf   RBC / HPF 0-2 <3 RBC/hpf   Bacteria, UA RARE RARE   Urine-Other MUCOUS PRESENT   CBC     Status: None   Collection Time: 12/04/14  9:45 AM  Result Value Ref Range   WBC 5.7 4.0 - 10.5 K/uL   RBC 4.24 3.87 - 5.11 MIL/uL   Hemoglobin 13.1 12.0 - 15.0 g/dL   HCT 39.1 36.0 - 46.0 %   MCV 92.2 78.0 - 100.0 fL   MCH 30.9 26.0 - 34.0 pg   MCHC 33.5 30.0 - 36.0  g/dL   RDW 13.1 11.5 - 15.5 %   Platelets  250 150 - 400 K/uL  hCG, quantitative, pregnancy     Status: Abnormal   Collection Time: 12/04/14  9:45 AM  Result Value Ref Range   hCG, Beta Chain, Quant, S 77758 (H) <5 mIU/mL  Wet prep, genital     Status: Abnormal   Collection Time: 12/04/14 10:10 AM  Result Value Ref Range   Yeast Wet Prep HPF POC NONE SEEN NONE SEEN   Trich, Wet Prep NONE SEEN NONE SEEN   Clue Cells Wet Prep HPF POC NONE SEEN NONE SEEN   WBC, Wet Prep HPF POC MODERATE (A) NONE SEEN     Plan: -Rjogam now -Discussed need to follow up in office -Bleeding and PTL Precautions -Encouraged to call if any questions or concerns arise prior to next scheduled office visit.  -Discharged to home in stable condition -pelvic rest Consulted with Dr. Zada Finders Londen Lorge, CNM, MSN 12/04/2014. 11:04 AM

## 2014-12-05 LAB — RH IG WORKUP (INCLUDES ABO/RH)
ABO/RH(D): O NEG
Antibody Screen: NEGATIVE
Gestational Age(Wks): 7.1
Unit division: 0

## 2014-12-06 LAB — GC/CHLAMYDIA PROBE AMP (~~LOC~~) NOT AT ARMC
CHLAMYDIA, DNA PROBE: NEGATIVE
Neisseria Gonorrhea: NEGATIVE

## 2015-01-23 NOTE — L&D Delivery Note (Signed)
Delivery Note At 4:29 PM a viable female was delivered via Vaginal, Spontaneous Delivery (Presentation: ; Occiput Anterior).  APGAR: 8, 9; weight  .   Placenta status: Intact, Spontaneous.  Cord: 3 vessels with the following complications: None.  Cord pH: n/a  Anesthesia: None  Episiotomy: None Lacerations: Labial bilateral and vaginal Suture Repair: 2.0 vicryl Est. Blood Loss (mL):  300cc  Mom to postpartum.  Baby to Couplet care / Skin to Skin.  Opie Maclaughlin Y 07/22/2015, 5:30 PM

## 2015-07-19 ENCOUNTER — Telehealth (HOSPITAL_COMMUNITY): Payer: Self-pay | Admitting: *Deleted

## 2015-07-19 ENCOUNTER — Encounter (HOSPITAL_COMMUNITY): Payer: Self-pay | Admitting: *Deleted

## 2015-07-19 NOTE — Telephone Encounter (Signed)
Preadmission screen  

## 2015-07-20 ENCOUNTER — Other Ambulatory Visit: Payer: Self-pay | Admitting: Obstetrics and Gynecology

## 2015-07-22 ENCOUNTER — Inpatient Hospital Stay (HOSPITAL_COMMUNITY)
Admission: RE | Admit: 2015-07-22 | Discharge: 2015-07-23 | DRG: 775 | Disposition: A | Discharge status: Home / Self Care | Payer: Federal, State, Local not specified - PPO | Source: Ambulatory Visit | Attending: Obstetrics and Gynecology | Admitting: Obstetrics and Gynecology

## 2015-07-22 ENCOUNTER — Inpatient Hospital Stay (HOSPITAL_COMMUNITY)
Admission: AD | Admit: 2015-07-22 | Payer: Federal, State, Local not specified - PPO | Source: Ambulatory Visit | Admitting: Obstetrics & Gynecology

## 2015-07-22 ENCOUNTER — Encounter (HOSPITAL_COMMUNITY): Payer: Self-pay

## 2015-07-22 DIAGNOSIS — O134 Gestational [pregnancy-induced] hypertension without significant proteinuria, complicating childbirth: Principal | ICD-10-CM | POA: Diagnosis present

## 2015-07-22 DIAGNOSIS — O133 Gestational [pregnancy-induced] hypertension without significant proteinuria, third trimester: Secondary | ICD-10-CM | POA: Diagnosis present

## 2015-07-22 DIAGNOSIS — Z3A40 40 weeks gestation of pregnancy: Secondary | ICD-10-CM | POA: Diagnosis not present

## 2015-07-22 DIAGNOSIS — Z23 Encounter for immunization: Secondary | ICD-10-CM

## 2015-07-22 DIAGNOSIS — Z3490 Encounter for supervision of normal pregnancy, unspecified, unspecified trimester: Secondary | ICD-10-CM

## 2015-07-22 LAB — CBC
HCT: 38 % (ref 36.0–46.0)
Hemoglobin: 12.8 g/dL (ref 12.0–15.0)
MCH: 30.9 pg (ref 26.0–34.0)
MCHC: 33.7 g/dL (ref 30.0–36.0)
MCV: 91.8 fL (ref 78.0–100.0)
PLATELETS: 227 10*3/uL (ref 150–400)
RBC: 4.14 MIL/uL (ref 3.87–5.11)
RDW: 14 % (ref 11.5–15.5)
WBC: 6.1 10*3/uL (ref 4.0–10.5)

## 2015-07-22 LAB — TYPE AND SCREEN
ABO/RH(D): O NEG
Antibody Screen: NEGATIVE

## 2015-07-22 LAB — RPR: RPR Ser Ql: NONREACTIVE

## 2015-07-22 LAB — OB RESULTS CONSOLE GBS: STREP GROUP B AG: NEGATIVE

## 2015-07-22 MED ORDER — MISOPROSTOL 25 MCG QUARTER TABLET
25.0000 ug | ORAL_TABLET | ORAL | Status: DC | PRN
Start: 2015-07-22 — End: 2015-07-22
  Filled 2015-07-22: qty 1

## 2015-07-22 MED ORDER — LIDOCAINE HCL (PF) 1 % IJ SOLN
30.0000 mL | INTRAMUSCULAR | Status: DC | PRN
  Filled 2015-07-22 (×2): qty 30

## 2015-07-22 MED ORDER — ACETAMINOPHEN 325 MG PO TABS: 650.0000 mg | ORAL_TABLET | ORAL | Status: DC | PRN

## 2015-07-22 MED ORDER — OXYCODONE-ACETAMINOPHEN 5-325 MG PO TABS: 2.0000 | ORAL_TABLET | ORAL | Status: DC | PRN

## 2015-07-22 MED ORDER — LIDOCAINE HCL (PF) 1 % IJ SOLN
30.0000 mL | INTRAMUSCULAR | Status: AC | PRN
  Administered 2015-07-22 (×2): 30 mL via SUBCUTANEOUS

## 2015-07-22 MED ORDER — WITCH HAZEL-GLYCERIN EX PADS: 1.0000 "application " | MEDICATED_PAD | CUTANEOUS | Status: DC | PRN

## 2015-07-22 MED ORDER — SENNOSIDES-DOCUSATE SODIUM 8.6-50 MG PO TABS
2.0000 | ORAL_TABLET | ORAL | Status: DC
  Administered 2015-07-22: 2 via ORAL
  Filled 2015-07-22: qty 2

## 2015-07-22 MED ORDER — IBUPROFEN 600 MG PO TABS
600.0000 mg | ORAL_TABLET | Freq: Four times a day (QID) | ORAL | Status: DC
  Administered 2015-07-22 – 2015-07-23 (×4): 600 mg via ORAL
  Filled 2015-07-22 (×5): qty 1

## 2015-07-22 MED ORDER — DIBUCAINE 1 % RE OINT: 1.0000 "application " | TOPICAL_OINTMENT | RECTAL | Status: DC | PRN

## 2015-07-22 MED ORDER — OXYTOCIN BOLUS FROM INFUSION: 500.0000 mL | INTRAVENOUS | Status: DC

## 2015-07-22 MED ORDER — COCONUT OIL OIL: 1.0000 "application " | TOPICAL_OIL | Status: DC | PRN

## 2015-07-22 MED ORDER — EPHEDRINE 5 MG/ML INJ
10.0000 mg | INTRAVENOUS | Status: DC | PRN
  Filled 2015-07-22: qty 2

## 2015-07-22 MED ORDER — OXYTOCIN 40 UNITS IN LACTATED RINGERS INFUSION - SIMPLE MED: 2.5000 [IU]/h | INTRAVENOUS | Status: DC

## 2015-07-22 MED ORDER — OXYTOCIN BOLUS FROM INFUSION
500.0000 mL | INTRAVENOUS | Status: DC
  Administered 2015-07-22: 500 mL via INTRAVENOUS

## 2015-07-22 MED ORDER — PHENYLEPHRINE 40 MCG/ML (10ML) SYRINGE FOR IV PUSH (FOR BLOOD PRESSURE SUPPORT)
80.0000 ug | PREFILLED_SYRINGE | INTRAVENOUS | Status: DC | PRN
  Filled 2015-07-22: qty 5

## 2015-07-22 MED ORDER — TERBUTALINE SULFATE 1 MG/ML IJ SOLN
0.2500 mg | Freq: Once | INTRAMUSCULAR | Status: DC | PRN
  Filled 2015-07-22: qty 1

## 2015-07-22 MED ORDER — PRENATAL MULTIVITAMIN CH
1.0000 | ORAL_TABLET | Freq: Every day | ORAL | Status: DC
  Administered 2015-07-23: 1 via ORAL
  Filled 2015-07-22: qty 1

## 2015-07-22 MED ORDER — SOD CITRATE-CITRIC ACID 500-334 MG/5ML PO SOLN
30.0000 mL | ORAL | Status: DC | PRN
Start: 2015-07-22 — End: 2015-07-22

## 2015-07-22 MED ORDER — ONDANSETRON HCL 4 MG/2ML IJ SOLN: 4.0000 mg | Freq: Four times a day (QID) | INTRAMUSCULAR | Status: DC | PRN

## 2015-07-22 MED ORDER — OXYCODONE-ACETAMINOPHEN 5-325 MG PO TABS: 1.0000 | ORAL_TABLET | ORAL | Status: DC | PRN

## 2015-07-22 MED ORDER — LACTATED RINGERS IV SOLN: 500.0000 mL | INTRAVENOUS | Status: DC | PRN

## 2015-07-22 MED ORDER — DIPHENHYDRAMINE HCL 50 MG/ML IJ SOLN: 12.5000 mg | INTRAMUSCULAR | Status: DC | PRN

## 2015-07-22 MED ORDER — LACTATED RINGERS IV SOLN: 500.0000 mL | Freq: Once | INTRAVENOUS | Status: DC

## 2015-07-22 MED ORDER — ZOLPIDEM TARTRATE 5 MG PO TABS: 5.0000 mg | ORAL_TABLET | Freq: Every evening | ORAL | Status: DC | PRN

## 2015-07-22 MED ORDER — FENTANYL CITRATE (PF) 100 MCG/2ML IJ SOLN: 50.0000 ug | INTRAMUSCULAR | Status: DC | PRN

## 2015-07-22 MED ORDER — FLEET ENEMA 7-19 GM/118ML RE ENEM: 1.0000 | ENEMA | RECTAL | Status: DC | PRN

## 2015-07-22 MED ORDER — LACTATED RINGERS IV SOLN
INTRAVENOUS | Status: DC
  Administered 2015-07-22: 09:00:00 via INTRAVENOUS

## 2015-07-22 MED ORDER — BENZOCAINE-MENTHOL 20-0.5 % EX AERO
1.0000 "application " | INHALATION_SPRAY | CUTANEOUS | Status: DC | PRN
  Administered 2015-07-23: 1 via TOPICAL
  Filled 2015-07-22: qty 56

## 2015-07-22 MED ORDER — FENTANYL 2.5 MCG/ML BUPIVACAINE 1/10 % EPIDURAL INFUSION (WH - ANES): 14.0000 mL/h | INTRAMUSCULAR | Status: DC | PRN

## 2015-07-22 MED ORDER — ONDANSETRON HCL 4 MG/2ML IJ SOLN: 4.0000 mg | INTRAMUSCULAR | Status: DC | PRN

## 2015-07-22 MED ORDER — LACTATED RINGERS IV SOLN: INTRAVENOUS | Status: DC

## 2015-07-22 MED ORDER — SIMETHICONE 80 MG PO CHEW: 80.0000 mg | CHEWABLE_TABLET | ORAL | Status: DC | PRN

## 2015-07-22 MED ORDER — DIPHENHYDRAMINE HCL 25 MG PO CAPS: 25.0000 mg | ORAL_CAPSULE | Freq: Four times a day (QID) | ORAL | Status: DC | PRN

## 2015-07-22 MED ORDER — OXYTOCIN 40 UNITS IN LACTATED RINGERS INFUSION - SIMPLE MED
1.0000 m[IU]/min | INTRAVENOUS | Status: DC
  Administered 2015-07-22: 8 m[IU]/min via INTRAVENOUS
  Administered 2015-07-22: 6 m[IU]/min via INTRAVENOUS
  Administered 2015-07-22: 1 m[IU]/min via INTRAVENOUS
  Filled 2015-07-22: qty 1000

## 2015-07-22 MED ORDER — ONDANSETRON HCL 4 MG PO TABS: 4.0000 mg | ORAL_TABLET | ORAL | Status: DC | PRN

## 2015-07-22 MED ORDER — TETANUS-DIPHTH-ACELL PERTUSSIS 5-2.5-18.5 LF-MCG/0.5 IM SUSP: 0.5000 mL | Freq: Once | INTRAMUSCULAR | Status: DC

## 2015-07-22 MED ORDER — SOD CITRATE-CITRIC ACID 500-334 MG/5ML PO SOLN: 30.0000 mL | ORAL | Status: DC | PRN

## 2015-07-22 NOTE — Progress Notes (Signed)
GBS negative per Dr. Mancel Bale and patient

## 2015-07-22 NOTE — Progress Notes (Signed)
Paula Perkins is a 40 y.o. [id number] at [redacted]w[redacted]d admitted for Induction secondary to [facility]  Subjective: No complaints.  Barely feeling contractions.  Objective: BP 112/71 mmHg  Pulse 89  Temp(Src) 98 F (36.7 C) (Oral)  Resp 18  Ht 5\' 4"  (1.626 m)  Wt 195 lb (88.451 kg)  BMI 33.46 kg/m2  LMP 10/15/2014      FHT:  FHR: 130s bpm, variability: moderate,  accelerations:  Present,  decelerations:  Absent UC:   irregular, every 1-6 minutes SVE:   Dilation: 2.5 Effacement (%): 70 Station: -2 Exam by:: dr Mancel Bale  Labs: Lab Results  Component Value Date   WBC 6.1 07/22/2015   HGB 12.8 07/22/2015   HCT 38.0 07/22/2015   MCV 91.8 07/22/2015   PLT 227 07/22/2015    Assessment / Plan: Induction on pitocin  Labor: Cont to titrate pitocin as indicated s/p AROM with clear fluid Preeclampsia:  no signs or symptoms of toxicity Fetal Wellbeing:  Category I Pain Control:  pain medicine upon request I/D:  GBS neg Anticipated MOD:  NSVD  Cait Locust Y 07/22/2015, 3:32 PM

## 2015-07-22 NOTE — H&P (Signed)
Paula Perkins is a 40 y.o. female presenting for induction of labor due to h/o CHTN on meds although has not required any meds this pregnancy . History OB History    Gravida Para Term Preterm AB TAB SAB Ectopic Multiple Living   5 2 2  2  2   2       Obstetric Comments   RECEIVED RHOGAM 2011     Past Medical History  Diagnosis Date  . Pelvic pain 2012  . Hx: UTI (urinary tract infection)   . Hydradenitis   . Hx of menorrhagia   . Labial abscess 01/2010  . Mastodynia 2008  . H/O varicella   . H/O measles   . CIN I (cervical intraepithelial neoplasia I) 2000  . Folliculitis [id number]    Left  buttock  . Candida vaginitis 07/2004  . H/O amenorrhea 07/2004  . Purulent vaginitis 12/2004  . Monilial vaginitis 2002  . Hydradenitis 2012  . H/O oral aphthous ulcers   . H/O chest pain   . Hx of migraines   . Abnormal Pap smear 2004 & 2005    CRYO  LAST PAP 02/2011  . Headache(784.0)     MIGRAINES WITH PREV PREG  . Fibroids 08/2010  . 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 SVD (spontaneous vaginal delivery)     x 1  . Anxiety     no meds  . Pregnancy induced hypertension     after 2nd delivery  . Multiple thyroid nodules   . Infection 1997    CHLAMYDIA; Paula Perkins  . Infection     OCC YEAST   Past Surgical History  Procedure Laterality Date  . Gynecologic cryosurgery  05/1998  . Eye surgery  04/2011    STYE ON R EYE  . Dilation and evacuation  07/11/2011    Procedure: DILATATION AND EVACUATION;  Surgeon: Paula Dawley, MD;  Location: [facility];  Service: Gynecology;  Laterality: N/A;  . Dilation and curettage of uterus     Family History: family history includes Alcohol abuse in her maternal uncle; Aneurysm in her maternal grandfather; Arthritis in her maternal aunt; Asthma in her other; Cancer in her maternal grandmother, paternal grandfather, and paternal uncle; Depression in her maternal uncle; Diabetes in her maternal aunt; Hearing loss in her cousin, father,  paternal grandfather, paternal uncle, and sister; Heart disease in her maternal aunt and maternal uncle; Hyperlipidemia in her mother; Hypertension in her mother; Kidney disease in her maternal grandfather. Social History:  reports that she has never smoked. She has never used smokeless tobacco. She reports that she drinks alcohol. She reports that she does not use illicit drugs.   Prenatal Transfer Tool  Maternal Diabetes: No Genetic Screening: Normal Maternal Ultrasounds/Referrals: Normal Fetal Ultrasounds or other Referrals:  None Maternal Substance Abuse:  No Significant Maternal Medications:  None Significant Maternal Lab Results:  Lab values include: Group B Strep negative Other Comments:  None  ROS Non-contributory  Dilation: 2 Effacement (%): 60 Station: -2 Exam by:: Paula Igo RN Blood pressure 105/80, pulse 84, temperature 98.3 F (36.8 C), temperature source Oral, resp. rate 20, height 5\' 4"  (1.626 m), weight 195 lb (88.451 kg), last menstrual period 10/15/2014, unknown if currently breastfeeding. Exam Physical Exam  Lungs CTA CV RRR Abd gravid VE per RN 2/60/-2 at 9:30am  Prenatal labs: ABO, Rh: --/--/O NEG (06/30 0840) Antibody: NEG (06/30 0840) Rubella: Immune (11/10 0000) RPR: Nonreactive (11/10 0000)  HBsAg: Negative (  11/10 0000)  HIV: Non-reactive (11/10 0000)  GBS: Negative (06/30 0000)   Assessment/Plan: P2 at 40wks being admitted for induction.  Will start with pitocin augmentation as pt is dilated some and contracting although not feeling them.  Fetal status is reassuring.  BP is good.   Paula Perkins Y 07/22/2015, 11:37 AM

## 2015-07-22 NOTE — Progress Notes (Signed)
Paula Perkins is a 40 y.o. [id number] at [redacted]w[redacted]d admitted for induction of labor secondary to [facility]  Subjective: No complaints  Objective: BP 125/82 mmHg  Pulse 85  Temp(Src) 98 F (36.7 C) (Oral)  Resp 18  Ht 5\' 4"  (1.626 m)  Wt 195 lb (88.451 kg)  BMI 33.46 kg/m2  LMP 10/15/2014      FHT:  FHR: 130 bpm, variability: moderate,  accelerations:  Present,  decelerations:  Absent UC:   regular, every 3-5 minutes SVE:   Dilation: 2 Effacement (%): 60 Station: -2 Exam by:: Joylene Igo RN  Labs: Lab Results  Component Value Date   WBC 6.1 07/22/2015   HGB 12.8 07/22/2015   HCT 38.0 07/22/2015   MCV 91.8 07/22/2015   PLT 227 07/22/2015    Assessment / Plan: Induction of labor due to [facility],  progressing well on pitocin  Labor: Progressing on Pitocin, will continue to increase then AROM Preeclampsia:  no signs or symptoms of toxicity Fetal Wellbeing:  Category I Pain Control:  pain medicine upon request although pt trying to avoid meds or epidural I/D:  GBS neg Anticipated MOD:  NSVD  Paula Perkins Y 07/22/2015, 12:51 PM

## 2015-07-22 NOTE — Anesthesia Pain Management Evaluation Note (Signed)
  CRNA Pain Management Visit Note  Patient: Paula Perkins, 40 y.o., female  "Hello I am a member of the anesthesia team at [facility]. We have an anesthesia team available at all times to provide care throughout the hospital, including epidural management and anesthesia for C-section. I don't know your plan for the delivery whether it a natural birth, water birth, IV sedation, nitrous supplementation, doula or epidural, but we want to meet your pain goals."   1.Was your pain managed to your expectations on prior hospitalizations?   Yes   2.What is your expectation for pain management during this hospitalization?     Labor support without medications  3.How can we help you reach that goal? Be available if needed  Record the patient's initial score and the patient's pain goal.   Pain: 0  Pain Goal: 10 [facility] wants you to be able to say your pain was always managed very well.  Paula Perkins 07/22/2015

## 2015-07-23 LAB — CBC
HCT: 30 % — ABNORMAL LOW (ref 36.0–46.0)
HEMOGLOBIN: 10.5 g/dL — AB (ref 12.0–15.0)
MCH: 31.9 pg (ref 26.0–34.0)
MCHC: 35 g/dL (ref 30.0–36.0)
MCV: 91.2 fL (ref 78.0–100.0)
PLATELETS: 216 10*3/uL (ref 150–400)
RBC: 3.29 MIL/uL — ABNORMAL LOW (ref 3.87–5.11)
RDW: 13.8 % (ref 11.5–15.5)
WBC: 11 10*3/uL — ABNORMAL HIGH (ref 4.0–10.5)

## 2015-07-23 MED ORDER — IBUPROFEN 600 MG PO TABS: 600.0000 mg | ORAL_TABLET | Freq: Four times a day (QID) | ORAL | Status: AC | PRN

## 2015-07-23 MED ORDER — NIFEDIPINE ER OSMOTIC RELEASE 30 MG PO TB24: 30.0000 mg | ORAL_TABLET | Freq: Every day | ORAL | Status: AC

## 2015-07-23 MED ORDER — RHO D IMMUNE GLOBULIN 1500 UNIT/2ML IJ SOSY
300.0000 ug | PREFILLED_SYRINGE | Freq: Once | INTRAMUSCULAR | Status: AC
  Administered 2015-07-23: 300 ug via INTRAVENOUS
  Filled 2015-07-23: qty 2

## 2015-07-23 NOTE — Lactation Note (Signed)
This note was copied from a baby's chart. Lactation Consultation Note: Experienced BF mom reports baby has been latching well but is giving bottles of formula because her milk isn't in yet. Encouraged frequent nursing to promote a good milk supply. Reports baby has been breast feeding better since she has been using a pacifier. Reviewed supply and demand. Baby asleep in bassinet at present. BF brochure given with resources for support after DC. Reviewed OP appointments and BFSG. No questions at present. Hopes to go home this afternoon. To call prn  Patient Name: Paula Perkins [id number] Date: 07/23/2015 Reason for consult: Initial assessment   Maternal Data Formula Feeding for Exclusion: Yes Reason for exclusion: Mother's choice to formula and breast feed on admission Does the patient have breastfeeding experience prior to this delivery?: Yes  Feeding Feeding Type: Bottle Fed - Formula  LATCH Score/Interventions                      Lactation Tools Discussed/Used     Consult Status Consult Status: Complete    Truddie Crumble 07/23/2015, 2:36 PM

## 2015-07-23 NOTE — Progress Notes (Signed)
Subjective: Postpartum Day 1: Vaginal delivery, bilateral labial and vaginal laceration--Chronic hypertension, no meds during pregnancy Patient up ad lib, reports no syncope or dizziness. Feeding: Breast/bottle Contraceptive plan:  Vasectomy  Interested in d/c today, if baby can go.  Objective: Vital signs in last 24 hours: Temp:  [97.5 F (36.4 C)-98.4 F (36.9 C)] 97.8 F (36.6 C) (06/30 2349) Pulse Rate:  [67-98] 69 (07/01 0600) Resp:  [18-20] 18 (07/01 0600) BP: (98-154)/(65-108) 98/72 mmHg (07/01 0600) Weight:  [88.451 kg (195 lb)] 88.451 kg (195 lb) (06/30 0820)  Filed Vitals:   07/22/15 1838 07/22/15 1930 07/22/15 2349 07/23/15 0600  BP: 133/68 118/70 128/65 98/72  Pulse: 69 67 80 69  Temp: 97.8 F (36.6 C) 97.5 F (36.4 C) 97.8 F (36.6 C)   TempSrc: Oral Oral Oral   Resp: 19 18 18 18   Height:      Weight:        Physical Exam:  General: alert Lochia: appropriate Uterine Fundus: firm Perineum: healing well DVT Evaluation: No evidence of DVT seen on physical exam. Negative Homan's sign.   CBC Latest Ref Rng 07/23/2015 07/22/2015 12/04/2014  WBC 4.0 - 10.5 K/uL 11.0(H) 6.1 5.7  Hemoglobin 12.0 - 15.0 g/dL 10.5(L) 12.8 13.1  Hematocrit 36.0 - 46.0 % 30.0(L) 38.0 39.1  Platelets 150 - 400 K/uL 216 227 250     Assessment/Plan: Status post vaginal delivery day 1. Chronic hypertension--no meds, but patient requests Rx for Nifedipine for use if BP elevated pp, at 30 mg XL dose utilized in early pregnancy. Stable Continue current care. Will do d/c orders for patient--if baby unable to be d/c'd, will defer d/c until tomorrow.     Donnel Saxon, [place] 07/23/2015, 7:32 AM

## 2015-07-23 NOTE — Discharge Summary (Signed)
[facility] Discharge Summary   Patient Name:   Paula Perkins DOB:     02/05/1975 MRN:     [id number]  Date of Admission:   07/22/2015 Date of Discharge:  07/23/2015  Admitting diagnosis:    INDUCTION Principal Problem:   Vaginal delivery Active Problems:   Pregnancy   Gestational hypertension w/o significant proteinuria in 3rd trimester      Discharge diagnosis:    INDUCTION Principal Problem:   Vaginal delivery Active Problems:   Pregnancy   Gestational hypertension w/o significant proteinuria in 3rd trimester                                                                      Post partum procedures: rhogam  Type of Delivery:  SVB  Delivering Provider: Everett Graff   Date of Delivery:  07/22/15  Newborn Data:    Live born female  Birth Weight: 6 lb 14.4 oz (3130 g) APGAR: 8, 9  Baby Feeding:   Bottle and Breast Disposition:   Status pending at time of presumptive d/c  Complications:   None  Hospital course:      Induction of Labor With Vaginal Delivery   40 y.o. yo [id number] at [redacted]w[redacted]d was admitted to the hospital 07/22/2015 for induction of labor.  Indication for induction: chronic hypertension..  Patient had an uncomplicated labor course as follows: Membrane Rupture Time/Date: 3:04 PM ,07/22/2015   Intrapartum Procedures: Episiotomy: None [1]                                         Lacerations:  Labial [10];Vaginal [6]  Patient had delivery of a Viable infant.  Information for the patient's newborn:  Dailee, Klingel Girl Donyae [id number]  Delivery Method: Vaginal, Spontaneous Delivery (Filed from Delivery Summary)   07/22/2015  Details of delivery can be found in separate delivery note.  Patient had a routine postpartum course. Patient is discharged home 07/23/2015. Patient desired Rx for Procardia 30 mg XL for q day use if BP noted to be elevated PP.  Will have patient come to office for BP check on 7/3 or 7/5.   Physical  Exam:   Filed Vitals:   07/22/15 1838 07/22/15 1930 07/22/15 2349 07/23/15 0600  BP: 133/68 118/70 128/65 98/72  Pulse: 69 67 80 69  Temp: 97.8 F (36.6 C) 97.5 F (36.4 C) 97.8 F (36.6 C)   TempSrc: Oral Oral Oral   Resp: 19 18 18 18   Height:      Weight:       General: alert Lochia: appropriate Uterine Fundus: firm Incision: Healing well with no significant drainage DVT Evaluation: No evidence of DVT seen on physical exam. Negative Homan's sign.  Labs: CBC Latest Ref Rng 07/23/2015 07/22/2015 12/04/2014  WBC 4.0 - 10.5 K/uL 11.0(H) 6.1 5.7  Hemoglobin 12.0 - 15.0 g/dL 10.5(L) 12.8 13.1  Hematocrit 36.0 - 46.0 % 30.0(L) 38.0 39.1  Platelets 150 - 400 K/uL 216 227 250    CMP Latest Ref Rng 03/23/2012  Glucose 70 - 99 mg/dL 89  BUN 6 - 23 mg/dL 7  Creatinine 0.50 - 1.10 mg/dL 0.71  Sodium 135 - 145 mEq/L 139  Potassium 3.5 - 5.1 mEq/L 3.3(L)  Chloride 96 - 112 mEq/L 103  CO2 19 - 32 mEq/L 25  Calcium 8.4 - 10.5 mg/dL 9.3  Total Protein 6.0 - 8.3 g/dL 8.0  Total Bilirubin 0.3 - 1.2 mg/dL 1.1  Alkaline Phos 39 - 117 U/L 83  AST 0 - 37 U/L 16  ALT 0 - 35 U/L 11    Discharge instruction: per After Visit Summary and "Baby and Me Booklet".  After Visit Meds:    Medication List    TAKE these medications        ibuprofen 600 MG tablet  Commonly known as:  ADVIL,MOTRIN  Take 1 tablet (600 mg total) by mouth every 6 (six) hours as needed.     NIFEdipine 30 MG 24 hr tablet  Commonly known as:  PROCARDIA XL  Take 1 tablet (30 mg total) by mouth daily.     prenatal multivitamin Tabs tablet  Take 1 tablet by mouth daily.     vitamin C 500 MG tablet  Commonly known as:  ASCORBIC ACID  Take 500 mg by mouth daily.     VITAMIN D (ERGOCALCIFEROL) PO  Take 1 capsule by mouth once a week.        Diet: routine diet  Activity: Advance as tolerated. Pelvic rest for 6 weeks.   Outpatient follow up:This week for BP check. Follow up Appt:No future appointments. Follow  up visit: No Follow-up on file.  Postpartum contraception: Vasectomy  07/23/2015 Donnel Saxon, CNM

## 2015-07-23 NOTE — Discharge Instructions (Signed)
Postpartum Care After Vaginal Delivery °After you deliver your newborn (postpartum period), the usual stay in the hospital is 24-72 hours. If there were problems with your labor or delivery, or if you have other medical problems, you might be in the hospital longer.  °While you are in the hospital, you will receive help and instructions on how to care for yourself and your newborn during the postpartum period.  °While you are in the hospital: °· Be sure to tell your nurses if you have pain or discomfort, as well as where you feel the pain and what makes the pain worse. °· If you had an incision made near your vagina (episiotomy) or if you had some tearing during delivery, the nurses may put ice packs on your episiotomy or tear. The ice packs may help to reduce the pain and swelling. °· If you are breastfeeding, you may feel uncomfortable contractions of your uterus for a couple of weeks. This is normal. The contractions help your uterus get back to normal size. °· It is normal to have some bleeding after delivery. °· For the first 1-3 days after delivery, the flow is red and the amount may be similar to a period. °· It is common for the flow to start and stop. °· In the first few days, you may pass some small clots. Let your nurses know if you begin to pass large clots or your flow increases. °· Do not  flush blood clots down the toilet before having the nurse look at them. °· During the next 3-10 days after delivery, your flow should become more watery and pink or brown-tinged in color. °· Ten to fourteen days after delivery, your flow should be a small amount of yellowish-white discharge. °· The amount of your flow will decrease over the first few weeks after delivery. Your flow may stop in 6-8 weeks. Most women have had their flow stop by 12 weeks after delivery. °· You should change your sanitary pads frequently. °· Wash your hands thoroughly with soap and water for at least 20 seconds after changing pads, using  the toilet, or before holding or feeding your newborn. °· You should feel like you need to empty your bladder within the first 6-8 hours after delivery. °· In case you become weak, lightheaded, or faint, call your nurse before you get out of bed for the first time and before you take a shower for the first time. °· Within the first few days after delivery, your breasts may begin to feel tender and full. This is called engorgement. Breast tenderness usually goes away within 48-72 hours after engorgement occurs. You may also notice milk leaking from your breasts. If you are not breastfeeding, do not stimulate your breasts. Breast stimulation can make your breasts produce more milk. °· Spending as much time as possible with your newborn is very important. During this time, you and your newborn can feel close and get to know each other. Having your newborn stay in your room (rooming in) will help to strengthen the bond with your newborn.  It will give you time to get to know your newborn and become comfortable caring for your newborn. °· Your hormones change after delivery. Sometimes the hormone changes can temporarily cause you to feel sad or tearful. These feelings should not last more than a few days. If these feelings last longer than that, you should talk to your caregiver. °· If desired, talk to your caregiver about methods of family planning or contraception. °·   Talk to your caregiver about immunizations. Your caregiver may want you to have the following immunizations before leaving the hospital:  Tetanus, diphtheria, and pertussis (Tdap) or tetanus and diphtheria (Td) immunization. It is very important that you and your family (including grandparents) or others caring for your newborn are up-to-date with the Tdap or Td immunizations. The Tdap or Td immunization can help protect your newborn from getting ill.  Rubella immunization.  Varicella (chickenpox) immunization.  Influenza immunization. You should  receive this annual immunization if you did not receive the immunization during your pregnancy.   This information is not intended to replace advice given to you by your health care provider. Make sure you discuss any questions you have with your health care provider.   Document Released: 11/05/2006 Document Revised: 10/03/2011 Document Reviewed: 09/05/2011 Elsevier Interactive Patient Education 2016 [employer].  Hypertension During Pregnancy Hypertension, or high blood pressure, is when there is extra pressure inside your blood vessels that carry blood from the heart to the rest of your body (arteries). It can happen at any time in life, including pregnancy. Hypertension during pregnancy can cause problems for you and your baby. Your baby might not weigh as much as he or she should at birth or might be born early (premature). Very bad cases of hypertension during pregnancy can be life-threatening.  Different types of hypertension can occur during pregnancy. These include:  Chronic hypertension. This happens when a woman has hypertension before pregnancy and it continues during pregnancy.  Gestational hypertension. This is when hypertension develops during pregnancy.  Preeclampsia or toxemia of pregnancy. This is a very serious type of hypertension that develops only during pregnancy. It affects the whole body and can be very dangerous for both mother and baby.  Gestational hypertension and preeclampsia usually go away after your baby is born. Your blood pressure will likely stabilize within 6 weeks. Women who have hypertension during pregnancy have a greater chance of developing hypertension later in life or with future pregnancies. RISK FACTORS There are certain factors that make it more likely for you to develop hypertension during pregnancy. These include:  Having hypertension before pregnancy.  Having hypertension during a previous pregnancy.  Being overweight.  Being older than 40  years.  Being pregnant with more than one baby.  Having diabetes or kidney problems. SIGNS AND SYMPTOMS Chronic and gestational hypertension rarely cause symptoms. Preeclampsia has symptoms, which may include:  Increased protein in your urine. Your health care provider will check for this at every prenatal visit.  Swelling of your hands and face.  Rapid weight gain.  Headaches.  Visual changes.  Being bothered by light.  Abdominal pain, especially in the upper right area.  Chest pain.  Shortness of breath.  Increased reflexes.  Seizures. These occur with a more severe form of preeclampsia, called eclampsia. DIAGNOSIS  You may be diagnosed with hypertension during a regular prenatal exam. At each prenatal visit, you may have:  Your blood pressure checked.  A urine test to check for protein in your urine. The type of hypertension you are diagnosed with depends on when you developed it. It also depends on your specific blood pressure reading.  Developing hypertension before 20 weeks of pregnancy is consistent with chronic hypertension.  Developing hypertension after 20 weeks of pregnancy is consistent with gestational hypertension.  Hypertension with increased urinary protein is diagnosed as preeclampsia.  Blood pressure measurements that stay above 025 systolic or 427 diastolic are a sign of severe preeclampsia. TREATMENT  Treatment for hypertension during pregnancy varies. Treatment depends on the type of hypertension and how serious it is.  If you take medicine for chronic hypertension, you may need to switch medicines.  Medicines called ACE inhibitors should not be taken during pregnancy.  Low-dose aspirin may be suggested for women who have risk factors for preeclampsia.  If you have gestational hypertension, you may need to take a blood pressure medicine that is safe during pregnancy. Your health care provider will recommend the correct medicine.  If you have  severe preeclampsia, you may need to be in the hospital. Health care providers will watch you and your baby very closely. You also may need to take medicine called magnesium sulfate to prevent seizures and lower blood pressure.  Sometimes, an early delivery is needed. This may be the case if the condition worsens. It would be done to protect you and your baby. The only cure for preeclampsia is delivery.  Your health care provider may recommend that you take one low-dose aspirin (81 mg) each day to help prevent high blood pressure during your pregnancy if you are at risk for preeclampsia. You may be at risk for preeclampsia if:  You had preeclampsia or eclampsia during a previous pregnancy.  Your baby did not grow as expected during a previous pregnancy.  You experienced preterm birth with a previous pregnancy.  You experienced a separation of the placenta from the uterus (placental abruption) during a previous pregnancy.  You experienced the loss of your baby during a previous pregnancy.  You are pregnant with more than one baby.  You have other medical conditions, such as diabetes or an autoimmune disease. HOME CARE INSTRUCTIONS  Schedule and keep all of your regular prenatal care appointments. This is important.  Take medicines only as directed by your health care provider. Tell your health care provider about all medicines you take.  Eat as little salt as possible.  Get regular exercise.  Do not drink alcohol.  Do not use tobacco products.  Do not drink products with caffeine.  Lie on your left side when resting. SEEK IMMEDIATE MEDICAL CARE IF:  You have severe abdominal pain.  You have sudden swelling in your hands, ankles, or face.  You gain 4 pounds (1.8 kg) or more in 1 week.  You vomit repeatedly.  You have vaginal bleeding.  You do not feel your baby moving as much.  You have a headache.  You have blurred or double vision.  You have muscle twitching or  spasms.  You have shortness of breath.  You have blue fingernails or lips.  You have blood in your urine. MAKE SURE YOU:  Understand these instructions.  Will watch your condition.  Will get help right away if you are not doing well or get worse.   This information is not intended to replace advice given to you by your health care provider. Make sure you discuss any questions you have with your health care provider.   Document Released: 09/26/2010 Document Revised: 01/29/2014 Document Reviewed: 08/07/2012 Elsevier Interactive Patient Education [employer].

## 2015-07-24 LAB — RH IG WORKUP (INCLUDES ABO/RH)
ABO/RH(D): O NEG
Fetal Screen: NEGATIVE
GESTATIONAL AGE(WKS): 40
Unit division: 0

## 2015-07-27 ENCOUNTER — Telehealth (HOSPITAL_COMMUNITY): Payer: Self-pay | Admitting: Lactation Services

## 2015-07-27 NOTE — Telephone Encounter (Signed)
Pt called in reference to a plugged duct near her L axilla. Mom's milk came to volume 2 days ago. She chose not to feed from the L breast once she discovered the plugged duct b/c she was concerned about the discomfort of its removal with her infant daughter's suction. However, she is pumping q2-3h. I recommended that Mom add massage and warm moist heat to area when pumping to dislodge the plugged duct. I also asked Mom to consider putting infant back to breast as it may be more effective and the movement of the plugged duct may not be as uncomfortable as she as is anticipating. Mom also reports taking lecithin. Mom is aware that it can sometimes take up to 48 hours to relieve a plugged duct & that it can lead to mastitis, if unresolved. Elinor Dodge, RN, IBCLC

## 2015-09-27 ENCOUNTER — Other Ambulatory Visit: Payer: Self-pay | Admitting: Endocrinology

## 2015-09-27 DIAGNOSIS — E042 Nontoxic multinodular goiter: Secondary | ICD-10-CM

## 2015-09-30 ENCOUNTER — Ambulatory Visit
Admission: RE | Admit: 2015-09-30 | Discharge: 2015-09-30 | Disposition: A | Discharge status: Home / Self Care | Payer: Federal, State, Local not specified - PPO | Source: Ambulatory Visit | Attending: Endocrinology | Admitting: Endocrinology

## 2015-09-30 DIAGNOSIS — E042 Nontoxic multinodular goiter: Secondary | ICD-10-CM

## 2016-02-06 ENCOUNTER — Other Ambulatory Visit: Payer: Self-pay | Admitting: Obstetrics and Gynecology

## 2016-02-06 DIAGNOSIS — Z1231 Encounter for screening mammogram for malignant neoplasm of breast: Secondary | ICD-10-CM

## 2016-03-02 ENCOUNTER — Ambulatory Visit
Admission: RE | Admit: 2016-03-02 | Discharge: 2016-03-02 | Disposition: A | Discharge status: Home / Self Care | Payer: Federal, State, Local not specified - PPO | Source: Ambulatory Visit | Attending: Obstetrics and Gynecology | Admitting: Obstetrics and Gynecology

## 2016-03-02 DIAGNOSIS — Z1231 Encounter for screening mammogram for malignant neoplasm of breast: Secondary | ICD-10-CM

## 2016-07-05 ENCOUNTER — Emergency Department (HOSPITAL_COMMUNITY)
Admission: EM | Admit: 2016-07-05 | Discharge: 2016-07-05 | Disposition: A | Discharge status: Home / Self Care | Payer: Federal, State, Local not specified - PPO | Attending: Emergency Medicine | Admitting: Emergency Medicine

## 2016-07-05 ENCOUNTER — Encounter (HOSPITAL_COMMUNITY): Payer: Self-pay

## 2016-07-05 DIAGNOSIS — R42 Dizziness and giddiness: Secondary | ICD-10-CM | POA: Diagnosis present

## 2016-07-05 DIAGNOSIS — I10 Essential (primary) hypertension: Secondary | ICD-10-CM | POA: Diagnosis not present

## 2016-07-05 DIAGNOSIS — Z79899 Other long term (current) drug therapy: Secondary | ICD-10-CM | POA: Diagnosis not present

## 2016-07-05 DIAGNOSIS — F419 Anxiety disorder, unspecified: Secondary | ICD-10-CM | POA: Insufficient documentation

## 2016-07-05 LAB — CBC
HCT: 42.1 % (ref 36.0–46.0)
Hemoglobin: 14.1 g/dL (ref 12.0–15.0)
MCH: 31.1 pg (ref 26.0–34.0)
MCHC: 33.5 g/dL (ref 30.0–36.0)
MCV: 92.7 fL (ref 78.0–100.0)
Platelets: 315 10*3/uL (ref 150–400)
RBC: 4.54 MIL/uL (ref 3.87–5.11)
RDW: 12.5 % (ref 11.5–15.5)
WBC: 6.7 10*3/uL (ref 4.0–10.5)

## 2016-07-05 LAB — BASIC METABOLIC PANEL
Anion gap: 8 (ref 5–15)
BUN: 5 mg/dL — ABNORMAL LOW (ref 6–20)
CO2: 27 mmol/L (ref 22–32)
Calcium: 9.2 mg/dL (ref 8.9–10.3)
Chloride: 104 mmol/L (ref 101–111)
Creatinine, Ser: 0.74 mg/dL (ref 0.44–1.00)
GFR calc Af Amer: >60 mL/min (ref >60)
GFR calc non Af Amer: >60 mL/min (ref >60)
Glucose, Bld: 105 mg/dL — ABNORMAL HIGH (ref 65–99)
Potassium: 3.3 mmol/L — ABNORMAL LOW (ref 3.5–5.1)
Sodium: 139 mmol/L (ref 135–145)

## 2016-07-05 LAB — I-STAT BETA HCG BLOOD, ED (MC, WL, AP ONLY): I-stat hCG, quantitative: <5 m[IU]/mL (ref <5)

## 2016-07-05 MED ORDER — POTASSIUM CHLORIDE CRYS ER 20 MEQ PO TBCR
40.0000 meq | EXTENDED_RELEASE_TABLET | Freq: Once | ORAL | Status: AC
Start: 2016-07-05 — End: 2016-07-05
  Administered 2016-07-05: 40 meq via ORAL
  Filled 2016-07-05: qty 2

## 2016-07-05 NOTE — ED Notes (Signed)
Walked to the bathroom without assistance and did well. Pt stated she had pressure in her head but was not dizzy.

## 2016-07-05 NOTE — Discharge Instructions (Signed)
Continue taking your home medications as prescribed. Continue drinking fluids at home to remain hydrated. I recommend following up with your primary care provider in the next 3-4 days if your symptoms have not improved. Please return to the Emergency Department if symptoms worsen or new onset of fever, headache, worsening lightheadedness, dizziness, visual changes, neck/back pain, numbness, weakness, syncope, chest pain, heart palpitations, difficulty breathing.

## 2016-07-05 NOTE — ED Provider Notes (Signed)
[facility] DEPT Provider Note   CSN: [id number] Arrival date & time: 07/05/16  1336     History   Chief Complaint Chief Complaint  Patient presents with  . dizziness/bilateral arm tingling    HPI Paula Perkins is a 41 y.o. female.  HPI   This is a 41 year old female with history of hypertension who presents the ED with complaint of lightheadedness. Patient reports over the past week she has had intermittent episodes of lightheadedness with associated intermittent tingling to her arms and legs, nausea and mild fatigue. Denies any aggravating or alleviating factors and reports symptoms occur at any time throughout the day. Denies any known triggers. She states 2 weeks ago she was having a mild intermittent headache but notes that has since resolved. She notes her symptoms over the past week are similar to when she has had vertigo in the past. Reports taking her home medications as prescribed along with aspirin without improvement of symptoms. However patient denies symptoms during initial evaluation. Denies fever, chills, headache, visual changes, neck pain/stiffness, shortness of breath, chest pain, palpitations, abdominal pain, vomiting, diarrhea, urinary symptoms, weakness, syncope. Patient states she feels like she has been overheated but states she has remained well-hydrated over the week. Denies any new medications.  PCP- Dr. Jeralene Huff  Past Medical History:  Diagnosis Date  . Abnormal Pap smear 2004 & 2005   CRYO  LAST PAP 02/2011  . Anxiety    no meds  . Candida vaginitis 07/2004  . CIN I (cervical intraepithelial neoplasia I) 2000  . Fibroids 08/2010  . Folliculitis 36/6294   Left  buttock  . H/O amenorrhea 07/2004  . H/O chest pain   . H/O measles   . H/O oral aphthous ulcers   . H/O varicella   . Headache(784.0)    MIGRAINES WITH PREV PREG  . Hearing loss    bilateral - has hearing aids  . Hx of menorrhagia   . Hx of migraines   . Hx: UTI (urinary tract  infection)   . Hydradenitis   . Hydradenitis 2012  . Infection 1997   CHLAMYDIA; [place]  . Infection    OCC YEAST  . Labial abscess 01/2010  . Mastodynia 2008  . Monilial vaginitis 2002  . Multiple thyroid nodules   . Pelvic pain 2012  . Pregnancy induced hypertension    after 2nd delivery  . Purulent vaginitis 12/2004  . SVD (spontaneous vaginal delivery)    x 1    Patient Active Problem List   Diagnosis Date Noted  . Pregnancy 07/22/2015  . Gestational hypertension w/o significant proteinuria in 3rd trimester 07/22/2015  . Vaginal delivery 12/17/2012  . Migraine 12/16/2012  . Advanced maternal age (AMA) in pregnancy 12/16/2012  . Rh negative status during pregnancy 12/16/2012  . Uterine size date discrepancy 12/16/2012  . Hearing deficit 12/16/2012  . First trimester bleeding 06/27/2011  . Rh negative state in antepartum period 06/27/2011  . Fibroids   . Pelvic pain   . Hx: UTI (urinary tract infection)   . Hydradenitis   . Hx of menorrhagia     Past Surgical History:  Procedure Laterality Date  . DILATION AND CURETTAGE OF UTERUS    . DILATION AND EVACUATION  07/11/2011   Procedure: DILATATION AND EVACUATION;  Surgeon: Ena Dawley, MD;  Location: [facility];  Service: Gynecology;  Laterality: N/A;  . EYE SURGERY  04/2011   STYE ON R EYE  . GYNECOLOGIC CRYOSURGERY  05/1998    OB  History    Gravida Para Term Preterm AB Living   5 3 3   2 3    SAB TAB Ectopic Multiple Live Births   2     0 3      Obstetric Comments   RECEIVED RHOGAM 2011       Home Medications    Prior to Admission medications   Medication Sig Start Date End Date Taking? Authorizing Provider  ibuprofen (ADVIL,MOTRIN) 600 MG tablet Take 1 tablet (600 mg total) by mouth every 6 (six) hours as needed. 07/23/15   Donnel Saxon, CNM  NIFEdipine (PROCARDIA XL) 30 MG 24 hr tablet Take 1 tablet (30 mg total) by mouth daily. 07/23/15   Donnel Saxon, CNM  Prenatal Vit-Fe Fumarate-FA (PRENATAL  MULTIVITAMIN) TABS Take 1 tablet by mouth daily.    [provider]  vitamin C (ASCORBIC ACID) 500 MG tablet Take 500 mg by mouth daily.    [provider]  VITAMIN D, ERGOCALCIFEROL, PO Take 1 capsule by mouth once a week.     [provider]    Family History Family History  Problem Relation Age of Onset  . Cancer Paternal Grandfather        stomach  . Hearing loss Paternal Grandfather   . Cancer Maternal Grandmother        ovarian; STOMACH  . Aneurysm Maternal Grandfather        stomach; aneurysm  . Kidney disease Maternal Grandfather        Kidney Stones; FAILURE  . Hypertension Mother   . Hyperlipidemia Mother   . Hearing loss Father   . Asthma Other   . Hearing loss Cousin   . Diabetes Maternal Aunt   . Heart disease Maternal Aunt   . Arthritis Maternal Aunt   . Heart disease Maternal Uncle        MI  . Depression Maternal Uncle   . Alcohol abuse Maternal Uncle   . Cancer Paternal Uncle        lung  . Hearing loss Paternal Uncle   . Hearing loss Sister     Social History Social History  Substance Use Topics  . Smoking status: Never Smoker  . Smokeless tobacco: Never Used  . Alcohol use Yes     Comment: socially     Allergies   Patient has no known allergies.   Review of Systems Review of Systems  Constitutional: Positive for fatigue.  Gastrointestinal: Positive for nausea.  Neurological: Positive for light-headedness and headaches.  All other systems reviewed and are negative.    Physical Exam Updated Vital Signs BP (!) 139/93 (BP Location: Right Arm)   Pulse (!) 105   Temp 98.7 F (37.1 C) (Oral)   Resp 16   Ht 5\' 4"  (1.626 m)   Wt 81.6 kg (180 lb)   SpO2 100%   BMI 30.90 kg/m   Physical Exam  Constitutional: She is oriented to person, place, and time. She appears well-developed and well-nourished. No distress.  HENT:  Head: Normocephalic and atraumatic.  Right Ear: Tympanic membrane normal.  Left Ear:  Tympanic membrane normal.  Nose: Nose normal. Right sinus exhibits no maxillary sinus tenderness and no frontal sinus tenderness. Left sinus exhibits no maxillary sinus tenderness and no frontal sinus tenderness.  Mouth/Throat: Uvula is midline, oropharynx is clear and moist and mucous membranes are normal. No oropharyngeal exudate, posterior oropharyngeal edema, posterior oropharyngeal erythema or tonsillar abscesses.  Eyes: Conjunctivae and EOM are normal. Pupils are equal,  round, and reactive to light. Right eye exhibits no discharge. Left eye exhibits no discharge. No scleral icterus.  No nystagmus.   Neck: Normal range of motion. Neck supple.  Cardiovascular: Normal rate, regular rhythm, normal heart sounds and intact distal pulses.   HR 93  Pulmonary/Chest: Effort normal and breath sounds normal. No respiratory distress. She has no wheezes. She has no rales. She exhibits no tenderness.  Abdominal: Soft. Bowel sounds are normal. She exhibits no distension and no mass. There is no tenderness. There is no rebound and no guarding.  Musculoskeletal: Normal range of motion. She exhibits no edema, tenderness or deformity.  No midline C, T, or L tenderness. Full range of motion of neck and back. Full range of motion of bilateral upper and lower extremities, with 5/5 strength. Sensation intact. 2+ radial and PT pulses. Cap refill <2 seconds. Patient able to stand and ambulate without assistance, no ataxia noted.  Lymphadenopathy:    She has no cervical adenopathy.  Neurological: She is alert and oriented to person, place, and time. She has normal strength. No cranial nerve deficit or sensory deficit. She displays a negative Romberg sign. Coordination normal.  Skin: Skin is warm and dry. She is not diaphoretic.  Nursing note and vitals reviewed.    ED Treatments / Results  Labs (all labs ordered are listed, but only abnormal results are displayed) Labs Reviewed  BASIC METABOLIC PANEL - Abnormal;  Notable for the following:       Result Value   Potassium 3.3 (*)    Glucose, Bld 105 (*)    BUN 5 (*)    All other components within normal limits  CBC  I-STAT BETA HCG BLOOD, ED ([facility], [facility], [facility] ONLY)    EKG  EKG Interpretation None       Radiology No results found.  Procedures Procedures (including critical care time)  Medications Ordered in ED Medications  potassium chloride SA (K-DUR,KLOR-CON) CR tablet 40 mEq (40 mEq Oral Given 07/05/16 1602)     Initial Impression / Assessment and Plan / ED Course  I have reviewed the triage vital signs and the nursing notes.  Pertinent labs & imaging results that were available during my care of the patient were reviewed by me and considered in my medical decision making (see chart for details).    Patient presents with intermittent episodes of lightheadedness with associated intermittent tingling to her arms and legs, nausea and fatigue. Denies LOC. Denies any known triggers or aggravating/alleviating factors. Reports having mild intermittent headaches 2 weeks ago which have since resolved. She reports having similar episode of symptoms a few years ago when she had vertigo. Patient currently denies any symptoms since arrival to the ED. VSS. Exam unremarkable. No neuro deficits. No midline spinal tenderness. Bilateral upper and lower extremities neurovascularly intact. No sensory deficits. No nystagmus. EKG showed sinus rhythm with no acute ischemic changes. Labs showed K 3.3, pt given oral potassium supplement in the ED. On reevaluation pt sitting resting in bed comfortably. Pt able to stand an ambulate without assistance. Pt continues to deny any lightheadedness or tingling. Discussed pt with Dr. Wilson Singer. Do not suspect an acute cardiac or neurological etiology at this time warranted further working or imaging. Discussed results and plan for d/c with pt. Plan to d/c pt home with symptomatic tx, advised to continue staying hydrated and follow up  with PCP if sxs continue. Discussed return precautions.   Final Clinical Impressions(s) / ED Diagnoses   Final diagnoses:  Lightheadedness    New Prescriptions New Prescriptions   No medications on file     Renold Don 07/05/16 1716    Virgel Manifold, MD 07/13/16 1134

## 2016-07-05 NOTE — ED Triage Notes (Signed)
Patient complains of 1 week of intermittent dizziness, bilateral arm tingling, and headache. States that she had had vertigo in past and thinks this is similar. No neuro deficits, alert and oriented.

## 2017-10-14 ENCOUNTER — Other Ambulatory Visit: Payer: Self-pay | Admitting: Family Medicine

## 2017-10-14 DIAGNOSIS — Z1231 Encounter for screening mammogram for malignant neoplasm of breast: Secondary | ICD-10-CM

## 2017-11-15 ENCOUNTER — Ambulatory Visit
Admission: RE | Admit: 2017-11-15 | Discharge: 2017-11-15 | Disposition: A | Discharge status: Home / Self Care | Payer: Federal, State, Local not specified - PPO | Source: Ambulatory Visit | Attending: Family Medicine | Admitting: Family Medicine

## 2017-11-15 DIAGNOSIS — Z1231 Encounter for screening mammogram for malignant neoplasm of breast: Secondary | ICD-10-CM

## 2017-12-11 ENCOUNTER — Other Ambulatory Visit: Payer: Self-pay | Admitting: Endocrinology

## 2017-12-11 DIAGNOSIS — E042 Nontoxic multinodular goiter: Secondary | ICD-10-CM

## 2017-12-13 ENCOUNTER — Ambulatory Visit
Admission: RE | Admit: 2017-12-13 | Discharge: 2017-12-13 | Disposition: A | Discharge status: Home / Self Care | Payer: Federal, State, Local not specified - PPO | Source: Ambulatory Visit | Attending: Endocrinology | Admitting: Endocrinology

## 2017-12-13 DIAGNOSIS — E042 Nontoxic multinodular goiter: Secondary | ICD-10-CM

## 2018-02-06 IMAGING — US US THYROID
1 series · 14 of 25 positions shown · non-contrast
Comparison: None.

CLINICAL DATA: Goiter.

EXAM:
THYROID ULTRASOUND
TECHNIQUE: Ultrasound examination of the thyroid gland and adjacent soft
tissues was performed.

[Series 1: us thyroid · 0.08mm/px · 14 of 64 slices shown]
[im 1/64]
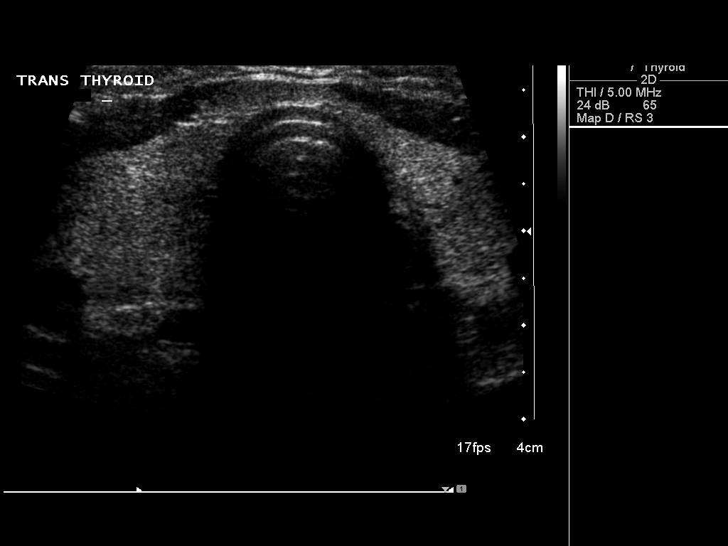
[im 6/64]
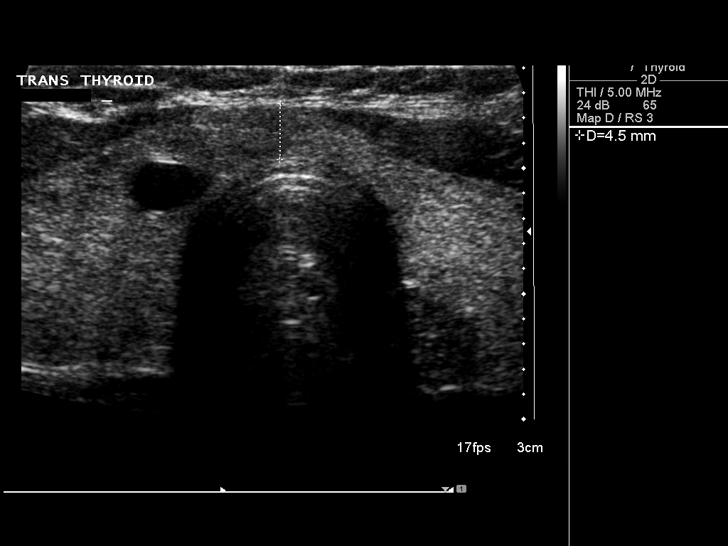
[im 11/64]
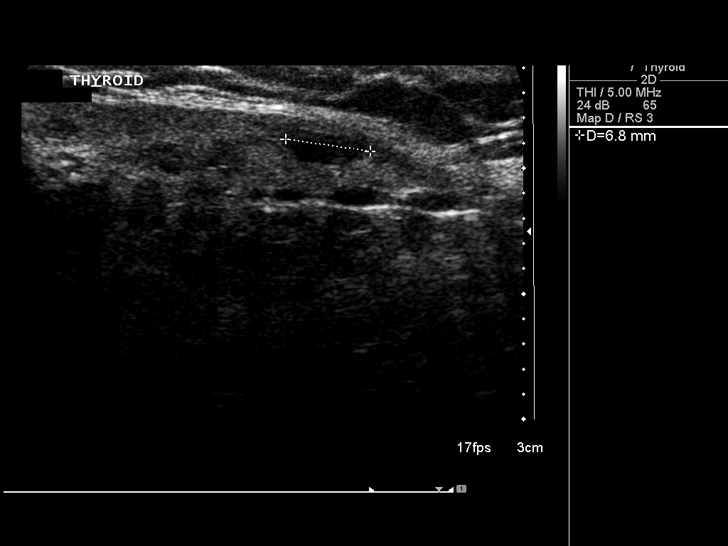
[im 16/64]
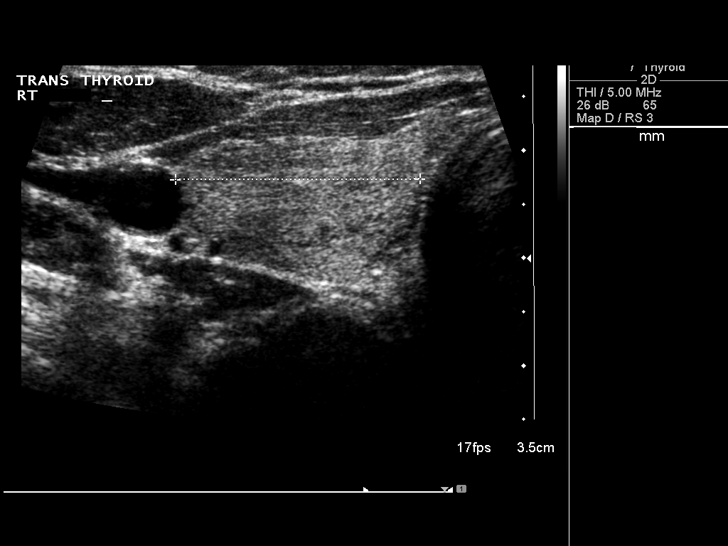
[im 22/64]
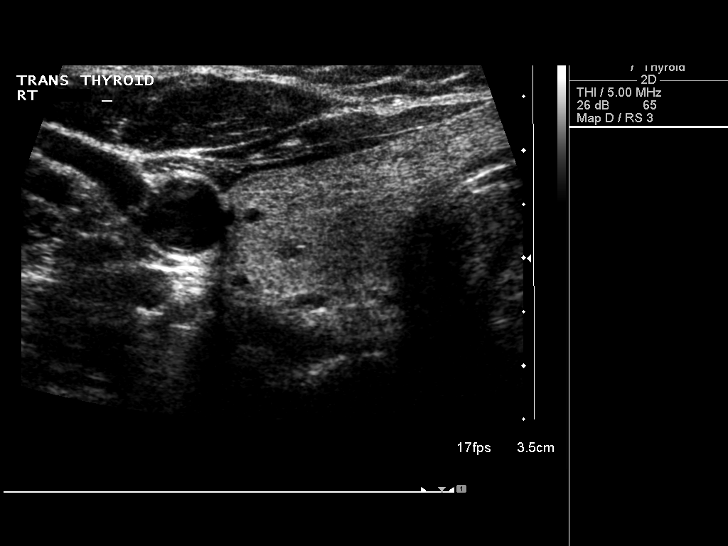
[im 24/64]
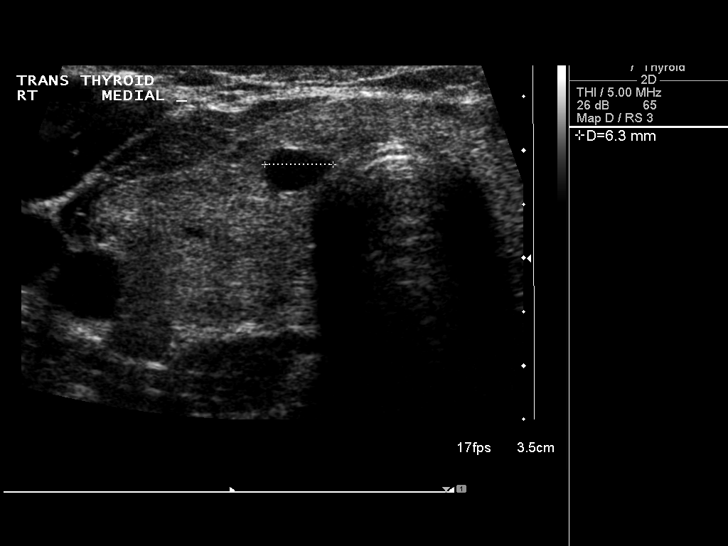
[im 29/64]
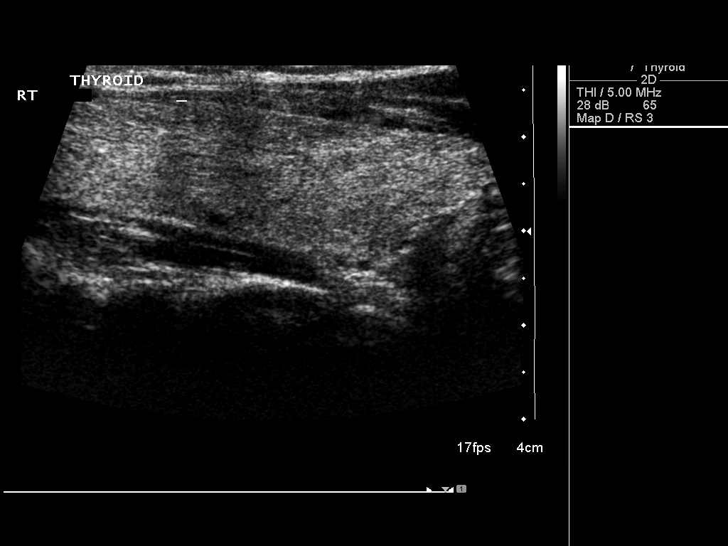
[im 35/64]
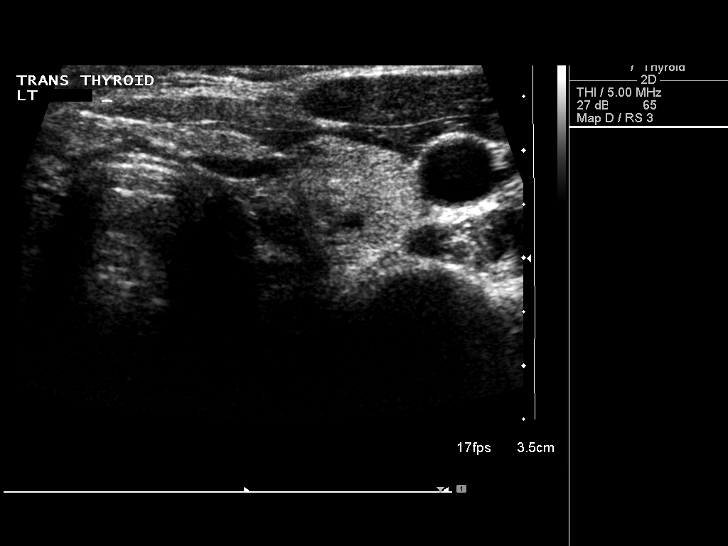
[im 40/64]
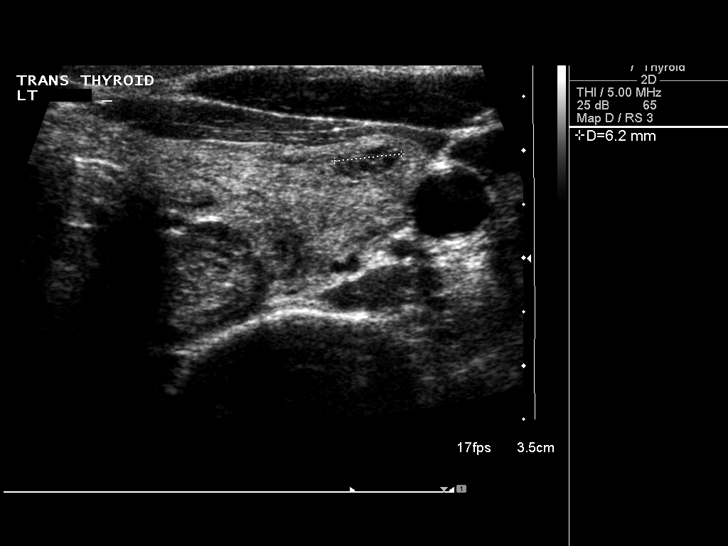
[im 43/64]
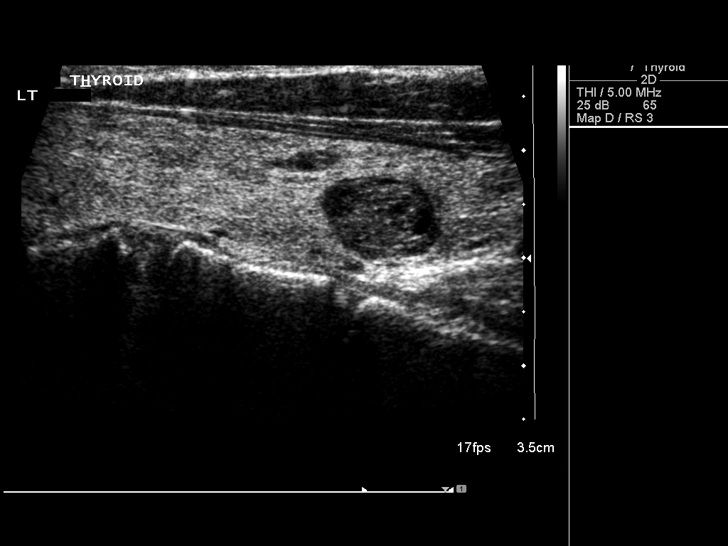
[im 48/64]
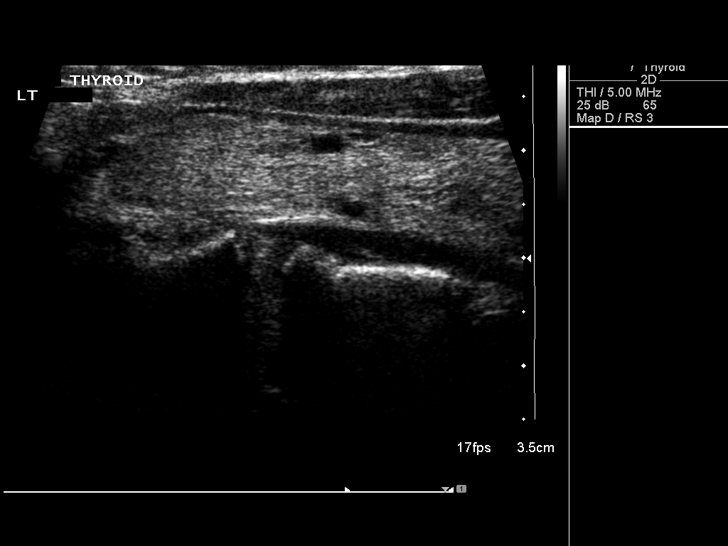
[im 53/64]
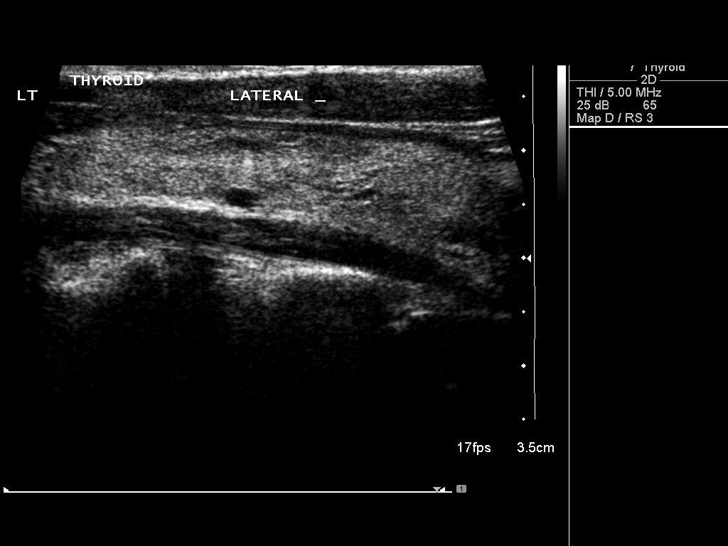
[im 58/64]
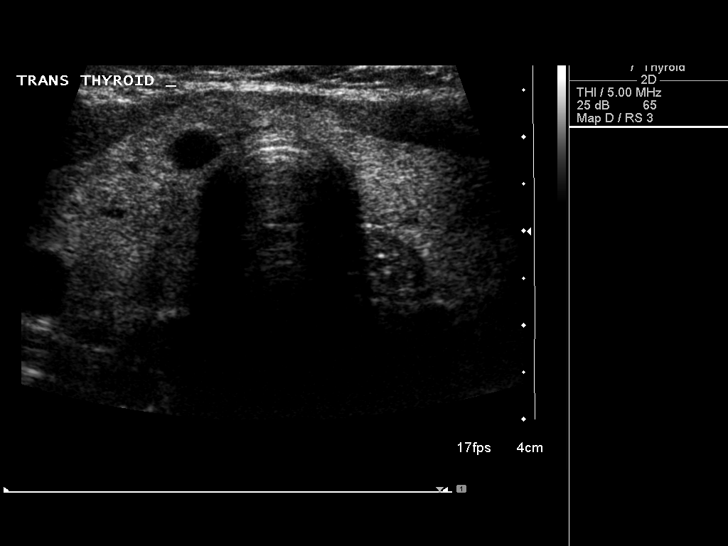
[im 64/64]
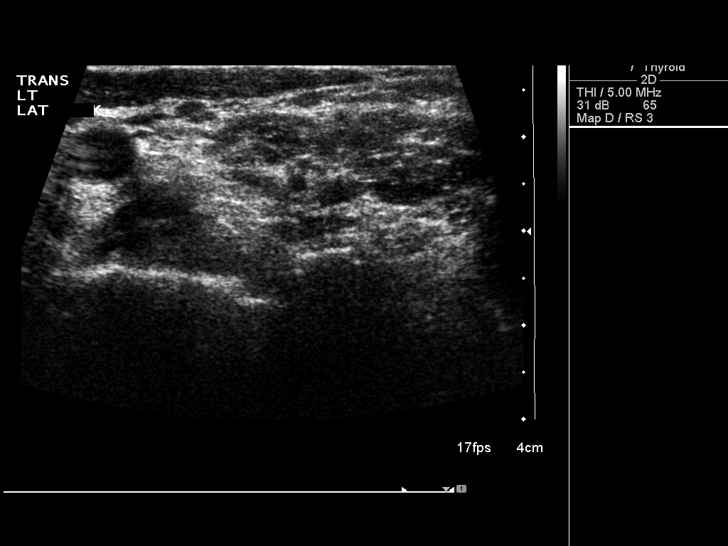

[14 of 25 positions shown; findings below may reference images not displayed]

FINDINGS: Parenchymal Echotexture: Mildly heterogenous

Estimated total number of nodules >/= 1 cm: 1

Number of spongiform nodules > 2 cm not described below (TR1): 0

Number of mixed cystic and solid nodules > 1.5 cm not described
below (TR2): 0

_________________________________________________________

Isthmus: 0.5 cm, previously 0.9 cm

0.8 cm cyst.

_________________________________________________________

Right lobe: 6.0 x 1.5 x 2.3 cm, previously 5.4 x 1.9 x 2.1 cm

0.6 cm mid lobe cyst.

_________________________________________________________

Left lobe: 5.2 x 1.4 x 2.2 cm, previously 5.0 x 1.6 x 2.1 cm

1.1 cm lower pole colloid cyst is stable.
IMPRESSION: Multiple bilateral cysts are identified. The largest is a left lower
pole colloid cyst measuring 1.1 cm. No further follow-up or biopsy
is recommended at this time.

The above is in keeping with the ACR TI-RADS recommendations - [HOSPITAL] 5739;[DATE].

## 2018-12-16 ENCOUNTER — Other Ambulatory Visit: Payer: Self-pay | Admitting: Endocrinology

## 2018-12-16 DIAGNOSIS — E042 Nontoxic multinodular goiter: Secondary | ICD-10-CM

## 2019-01-06 ENCOUNTER — Other Ambulatory Visit: Payer: Self-pay | Admitting: Family Medicine

## 2019-01-06 DIAGNOSIS — Z1231 Encounter for screening mammogram for malignant neoplasm of breast: Secondary | ICD-10-CM

## 2019-02-24 ENCOUNTER — Ambulatory Visit
Admission: RE | Admit: 2019-02-24 | Discharge: 2019-02-24 | Disposition: A | Discharge status: Home / Self Care | Payer: Federal, State, Local not specified - PPO | Source: Ambulatory Visit | Attending: Family Medicine | Admitting: Family Medicine

## 2019-02-24 ENCOUNTER — Other Ambulatory Visit: Payer: Self-pay

## 2019-02-24 DIAGNOSIS — Z1231 Encounter for screening mammogram for malignant neoplasm of breast: Secondary | ICD-10-CM

## 2019-12-16 ENCOUNTER — Ambulatory Visit
Admission: RE | Admit: 2019-12-16 | Discharge: 2019-12-16 | Disposition: A | Discharge status: Home / Self Care | Payer: Federal, State, Local not specified - PPO | Source: Ambulatory Visit | Attending: Endocrinology | Admitting: Endocrinology

## 2019-12-16 DIAGNOSIS — E042 Nontoxic multinodular goiter: Secondary | ICD-10-CM

## 2020-07-13 ENCOUNTER — Other Ambulatory Visit: Payer: Self-pay | Admitting: Family Medicine

## 2020-07-13 DIAGNOSIS — Z1231 Encounter for screening mammogram for malignant neoplasm of breast: Secondary | ICD-10-CM

## 2020-07-21 ENCOUNTER — Inpatient Hospital Stay: Admission: RE | Admit: 2020-07-21 | Payer: Federal, State, Local not specified - PPO | Source: Ambulatory Visit

## 2021-02-27 ENCOUNTER — Other Ambulatory Visit: Payer: Self-pay | Admitting: Family Medicine

## 2021-02-27 DIAGNOSIS — N644 Mastodynia: Secondary | ICD-10-CM

## 2021-04-05 ENCOUNTER — Other Ambulatory Visit: Payer: Federal, State, Local not specified - PPO

## 2021-04-27 ENCOUNTER — Other Ambulatory Visit: Payer: Federal, State, Local not specified - PPO

## 2021-05-04 ENCOUNTER — Ambulatory Visit: Payer: Federal, State, Local not specified - PPO

## 2021-05-04 ENCOUNTER — Ambulatory Visit
Admission: RE | Admit: 2021-05-04 | Discharge: 2021-05-04 | Disposition: A | Discharge status: Home / Self Care | Payer: Federal, State, Local not specified - PPO | Source: Ambulatory Visit | Attending: Family Medicine | Admitting: Family Medicine

## 2021-05-04 DIAGNOSIS — N644 Mastodynia: Secondary | ICD-10-CM

## 2023-05-15 ENCOUNTER — Other Ambulatory Visit: Payer: Self-pay | Admitting: Obstetrics and Gynecology

## 2023-05-15 DIAGNOSIS — Z1231 Encounter for screening mammogram for malignant neoplasm of breast: Secondary | ICD-10-CM

## 2023-05-21 ENCOUNTER — Ambulatory Visit
Admission: RE | Admit: 2023-05-21 | Discharge: 2023-05-21 | Disposition: A | Discharge status: Home / Self Care | Source: Ambulatory Visit | Attending: Obstetrics and Gynecology | Admitting: Obstetrics and Gynecology

## 2023-05-21 DIAGNOSIS — Z1231 Encounter for screening mammogram for malignant neoplasm of breast: Secondary | ICD-10-CM

## 2023-09-11 IMAGING — MG DIGITAL DIAGNOSTIC BILAT W/ TOMO W/ CAD
8 series · 8 of 24 positions shown · non-contrast
Comparison: Previous exam(s).

CLINICAL DATA: Nonfocal bilateral breast pain.

EXAM:
DIGITAL DIAGNOSTIC BILATERAL MAMMOGRAM WITH TOMOSYNTHESIS AND CAD
TECHNIQUE: Bilateral digital diagnostic mammography and breast tomosynthesis
was performed. The images were evaluated with computer-aided
detection.

[L CC synth-2D]
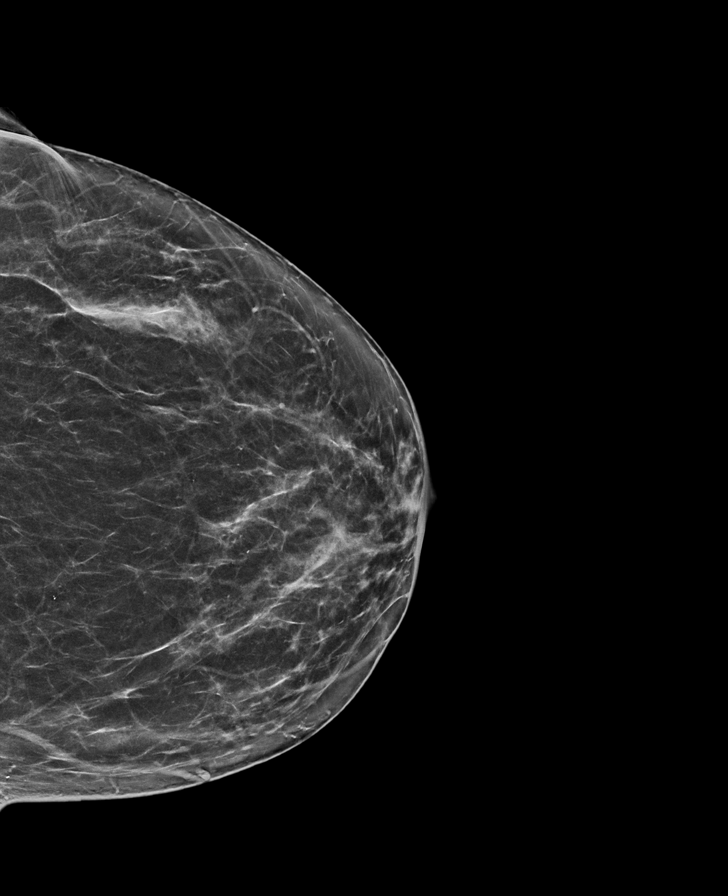

[L MLO synth-2D]
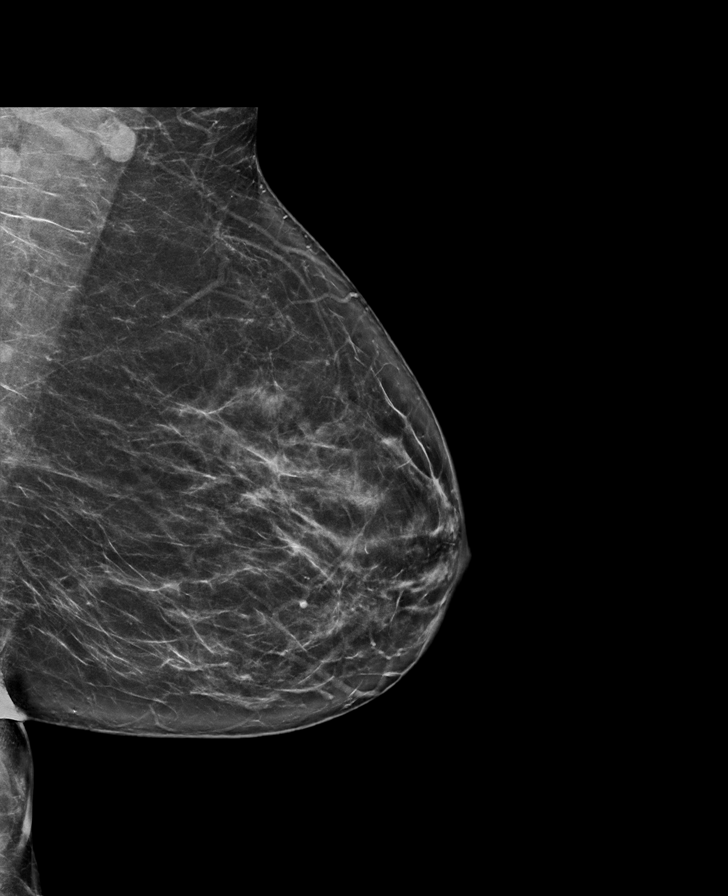

[R MLO synth-2D]
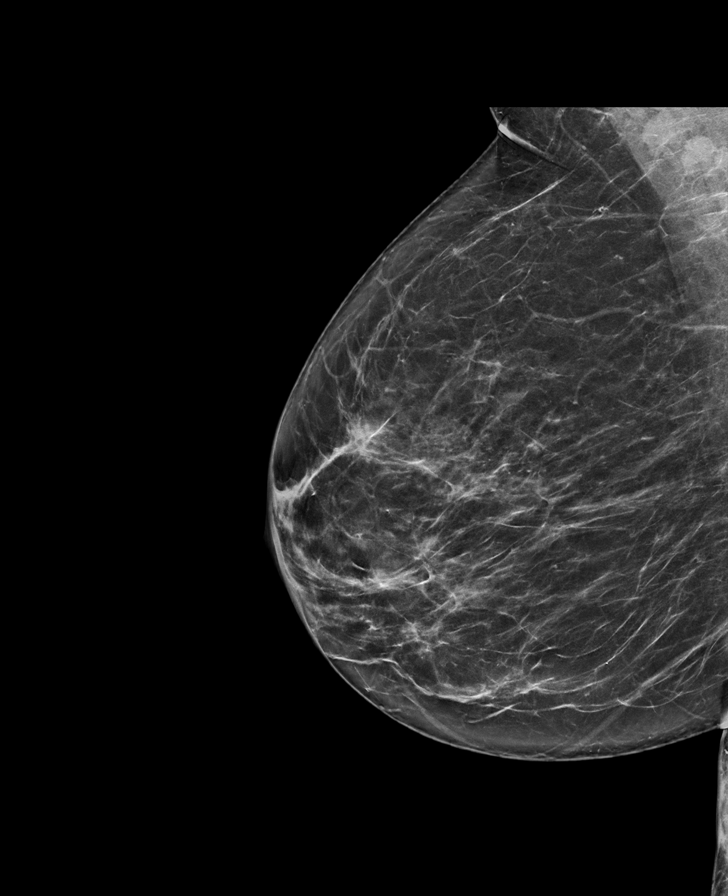

[R CC synth-2D]
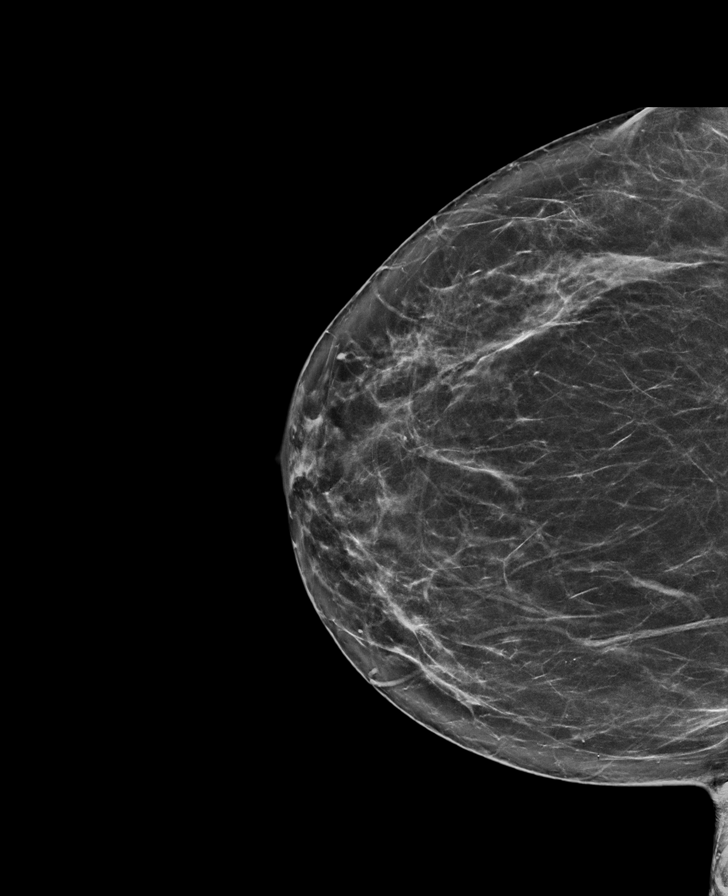

[L MLO tomo · tomo slice 39/78.0]
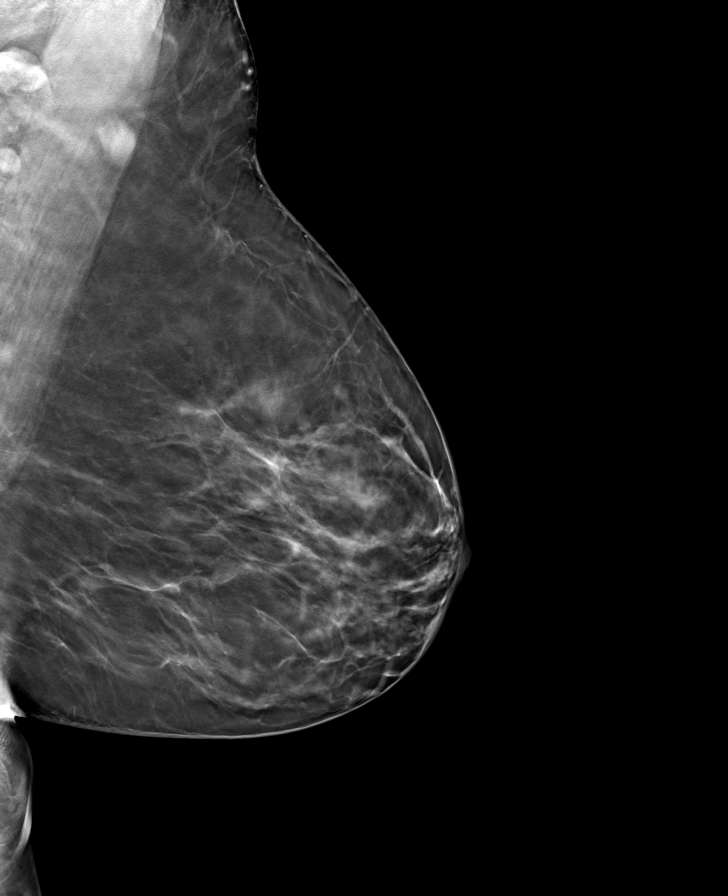

[R CC tomo · tomo slice 37/72.0]
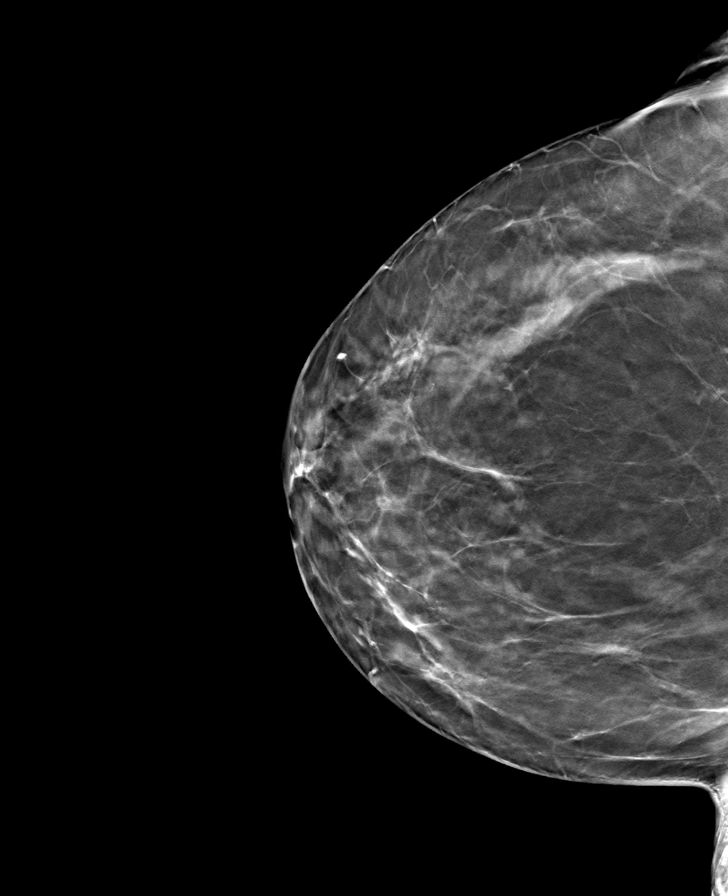

[L CC tomo · tomo slice 35/70.0]
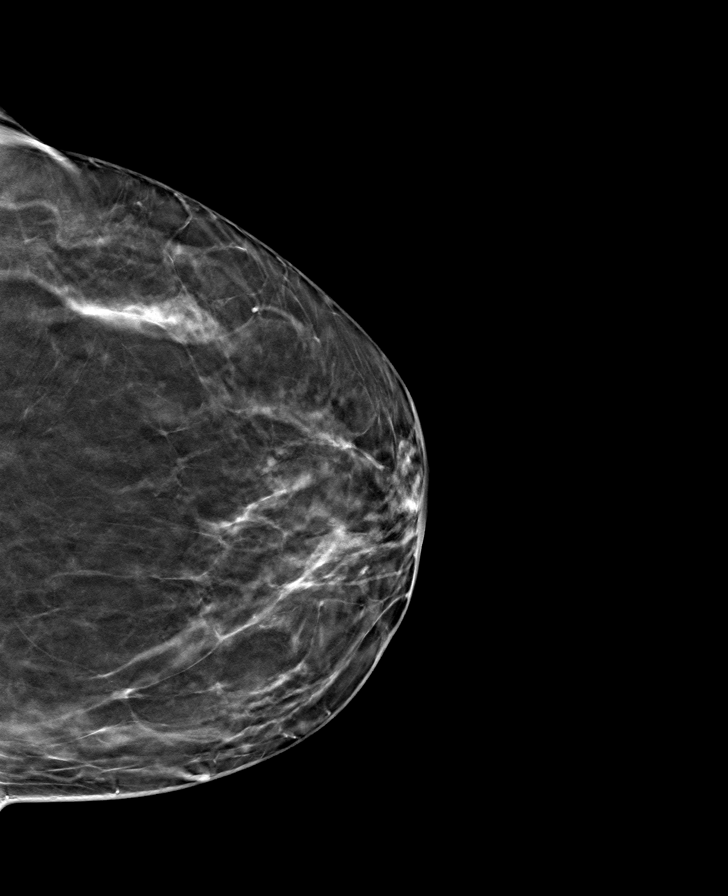

[R MLO tomo · tomo slice 39/77.0]
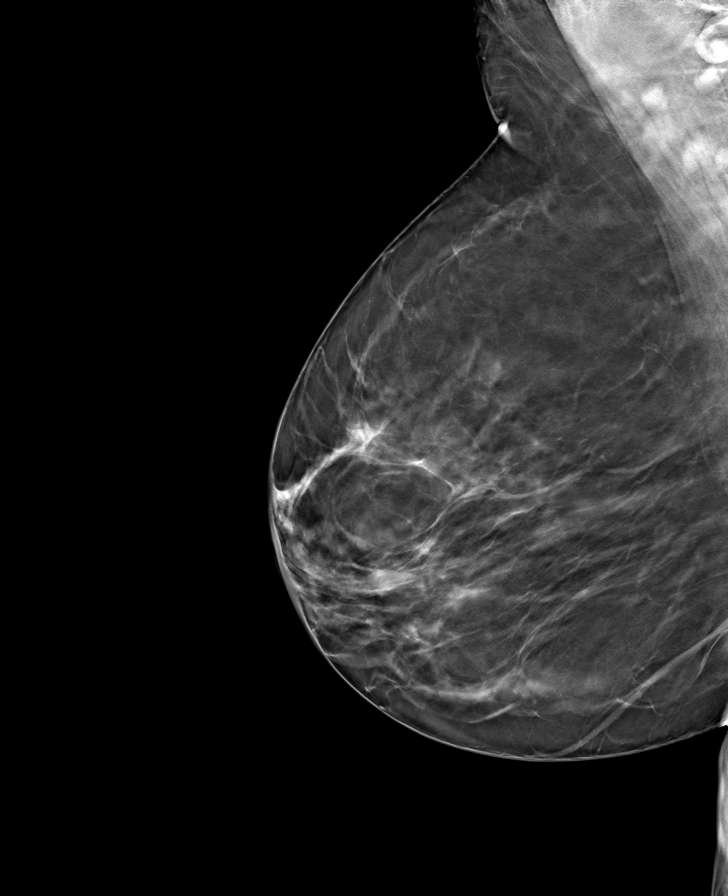

[8 of 24 positions shown; findings below may reference images not displayed]

ACR Breast Density Category b: There are scattered areas of
fibroglandular density.
FINDINGS: No suspicious masses, calcifications, or distortion are identified
in either breast.
IMPRESSION: No mammographic evidence of malignancy.

RECOMMENDATION:
Treatment of the patient's symptoms should be based on clinical and
physical exam given lack of imaging findings. Recommend annual
screening mammography.

I have discussed the findings and recommendations with the patient.
If applicable, a reminder letter will be sent to the patient
regarding the next appointment.

BI-RADS CATEGORY  1: Negative.
